# Patient Record
Sex: Female | Born: 1935 | Race: White | Hispanic: No | Marital: Single | Smoking: Never smoker
Health system: Southern US, Community
[De-identification: ages and names within clinical notes are randomized; demographics above are authoritative.]

## PROBLEM LIST (undated history)

## (undated) DIAGNOSIS — E785 Hyperlipidemia, unspecified: Secondary | ICD-10-CM

## (undated) DIAGNOSIS — R42 Dizziness and giddiness: Secondary | ICD-10-CM

## (undated) DIAGNOSIS — E559 Vitamin D deficiency, unspecified: Secondary | ICD-10-CM

## (undated) DIAGNOSIS — R55 Syncope and collapse: Secondary | ICD-10-CM

## (undated) DIAGNOSIS — R7303 Prediabetes: Secondary | ICD-10-CM

## (undated) DIAGNOSIS — E079 Disorder of thyroid, unspecified: Secondary | ICD-10-CM

## (undated) DIAGNOSIS — I10 Essential (primary) hypertension: Secondary | ICD-10-CM

## (undated) DIAGNOSIS — R011 Cardiac murmur, unspecified: Secondary | ICD-10-CM

## (undated) HISTORY — DX: Vitamin D deficiency, unspecified: E55.9

## (undated) HISTORY — DX: Cardiac murmur, unspecified: R01.1

## (undated) HISTORY — DX: Dizziness and giddiness: R42

## (undated) HISTORY — DX: Syncope and collapse: R55

## (undated) HISTORY — DX: Hyperlipidemia, unspecified: E78.5

## (undated) HISTORY — DX: Prediabetes: R73.03

## (undated) HISTORY — PX: TONSILLECTOMY: SUR1361

---

## 2008-05-12 ENCOUNTER — Encounter: Payer: Self-pay | Admitting: Pulmonary Disease

## 2008-05-20 DIAGNOSIS — J45909 Unspecified asthma, uncomplicated: Secondary | ICD-10-CM | POA: Insufficient documentation

## 2008-05-20 DIAGNOSIS — I10 Essential (primary) hypertension: Secondary | ICD-10-CM

## 2008-05-20 DIAGNOSIS — E039 Hypothyroidism, unspecified: Secondary | ICD-10-CM | POA: Insufficient documentation

## 2008-05-23 ENCOUNTER — Ambulatory Visit: Payer: Self-pay | Admitting: Pulmonary Disease

## 2008-05-23 DIAGNOSIS — R0602 Shortness of breath: Secondary | ICD-10-CM

## 2011-09-13 DIAGNOSIS — H6120 Impacted cerumen, unspecified ear: Secondary | ICD-10-CM | POA: Diagnosis not present

## 2011-09-13 DIAGNOSIS — M26609 Unspecified temporomandibular joint disorder, unspecified side: Secondary | ICD-10-CM | POA: Diagnosis not present

## 2012-06-11 DIAGNOSIS — Z0000 Encounter for general adult medical examination without abnormal findings: Secondary | ICD-10-CM | POA: Diagnosis not present

## 2012-06-11 DIAGNOSIS — E7800 Pure hypercholesterolemia, unspecified: Secondary | ICD-10-CM | POA: Diagnosis not present

## 2012-06-11 DIAGNOSIS — E119 Type 2 diabetes mellitus without complications: Secondary | ICD-10-CM | POA: Diagnosis not present

## 2012-06-11 DIAGNOSIS — I10 Essential (primary) hypertension: Secondary | ICD-10-CM | POA: Diagnosis not present

## 2012-06-11 DIAGNOSIS — E039 Hypothyroidism, unspecified: Secondary | ICD-10-CM | POA: Diagnosis not present

## 2012-06-11 DIAGNOSIS — Z79899 Other long term (current) drug therapy: Secondary | ICD-10-CM | POA: Diagnosis not present

## 2012-06-11 DIAGNOSIS — Z1331 Encounter for screening for depression: Secondary | ICD-10-CM | POA: Diagnosis not present

## 2013-01-22 DIAGNOSIS — E119 Type 2 diabetes mellitus without complications: Secondary | ICD-10-CM | POA: Diagnosis not present

## 2013-01-22 DIAGNOSIS — E039 Hypothyroidism, unspecified: Secondary | ICD-10-CM | POA: Diagnosis not present

## 2013-01-22 DIAGNOSIS — E7800 Pure hypercholesterolemia, unspecified: Secondary | ICD-10-CM | POA: Diagnosis not present

## 2013-01-22 DIAGNOSIS — K5900 Constipation, unspecified: Secondary | ICD-10-CM | POA: Diagnosis not present

## 2013-01-22 DIAGNOSIS — E559 Vitamin D deficiency, unspecified: Secondary | ICD-10-CM | POA: Diagnosis not present

## 2013-05-17 DIAGNOSIS — E039 Hypothyroidism, unspecified: Secondary | ICD-10-CM | POA: Diagnosis not present

## 2013-05-17 DIAGNOSIS — E7800 Pure hypercholesterolemia, unspecified: Secondary | ICD-10-CM | POA: Diagnosis not present

## 2013-05-17 DIAGNOSIS — E559 Vitamin D deficiency, unspecified: Secondary | ICD-10-CM | POA: Diagnosis not present

## 2013-06-15 DIAGNOSIS — Z23 Encounter for immunization: Secondary | ICD-10-CM | POA: Diagnosis not present

## 2013-06-22 DIAGNOSIS — I10 Essential (primary) hypertension: Secondary | ICD-10-CM | POA: Diagnosis not present

## 2013-06-22 DIAGNOSIS — M79609 Pain in unspecified limb: Secondary | ICD-10-CM | POA: Diagnosis not present

## 2013-07-29 DIAGNOSIS — I10 Essential (primary) hypertension: Secondary | ICD-10-CM | POA: Diagnosis not present

## 2013-07-29 DIAGNOSIS — E039 Hypothyroidism, unspecified: Secondary | ICD-10-CM | POA: Diagnosis not present

## 2013-07-31 ENCOUNTER — Emergency Department (HOSPITAL_COMMUNITY)
Admission: EM | Admit: 2013-07-31 | Discharge: 2013-07-31 | Disposition: A | Discharge status: Home / Self Care | Payer: Medicare Other | Attending: Emergency Medicine | Admitting: Emergency Medicine

## 2013-07-31 ENCOUNTER — Emergency Department (HOSPITAL_COMMUNITY): Payer: Medicare Other

## 2013-07-31 ENCOUNTER — Encounter (HOSPITAL_COMMUNITY): Payer: Self-pay | Admitting: Emergency Medicine

## 2013-07-31 DIAGNOSIS — Z79899 Other long term (current) drug therapy: Secondary | ICD-10-CM | POA: Insufficient documentation

## 2013-07-31 DIAGNOSIS — R42 Dizziness and giddiness: Secondary | ICD-10-CM | POA: Diagnosis not present

## 2013-07-31 DIAGNOSIS — E079 Disorder of thyroid, unspecified: Secondary | ICD-10-CM | POA: Diagnosis not present

## 2013-07-31 DIAGNOSIS — I10 Essential (primary) hypertension: Secondary | ICD-10-CM | POA: Diagnosis not present

## 2013-07-31 DIAGNOSIS — J45909 Unspecified asthma, uncomplicated: Secondary | ICD-10-CM | POA: Diagnosis not present

## 2013-07-31 HISTORY — DX: Disorder of thyroid, unspecified: E07.9

## 2013-07-31 HISTORY — DX: Essential (primary) hypertension: I10

## 2013-07-31 LAB — CBC
HCT: 42.1 % (ref 36.0–46.0)
Hemoglobin: 14.1 g/dL (ref 12.0–15.0)
MCH: 30.9 pg (ref 26.0–34.0)
MCHC: 33.5 g/dL (ref 30.0–36.0)
MCV: 92.1 fL (ref 78.0–100.0)
PLATELETS: 273 10*3/uL (ref 150–400)
RBC: 4.57 MIL/uL (ref 3.87–5.11)
RDW: 12.7 % (ref 11.5–15.5)
WBC: 7.7 10*3/uL (ref 4.0–10.5)

## 2013-07-31 LAB — BASIC METABOLIC PANEL
BUN: 15 mg/dL (ref 6–23)
CHLORIDE: 96 meq/L (ref 96–112)
CO2: 25 meq/L (ref 19–32)
CREATININE: 0.64 mg/dL (ref 0.50–1.10)
Calcium: 9.6 mg/dL (ref 8.4–10.5)
GFR calc non Af Amer: 82 mL/min — ABNORMAL LOW (ref >90)
Glucose, Bld: 129 mg/dL — ABNORMAL HIGH (ref 70–99)
POTASSIUM: 4.9 meq/L (ref 3.7–5.3)
SODIUM: 135 meq/L — AB (ref 137–147)

## 2013-07-31 LAB — POCT I-STAT TROPONIN I: Troponin i, poc: 0.01 ng/mL (ref 0.00–0.08)

## 2013-07-31 NOTE — ED Provider Notes (Signed)
CSN: [id number]     Arrival date & time 07/31/13  1504 History   First MD Initiated Contact with Patient 07/31/13 1818     Chief Complaint  Patient presents with  . Hypertension   (Consider location/radiation/quality/duration/timing/severity/associated sxs/prior Treatment) HPI Comments: Patient is an 80 year old female with past medical history of hypertension and thyroid disease who presents to the emergency department with hypertension x3 days. Patient states on Thursday when she checked her blood pressure at home, it was almost 200 systolic, went to her primary care physician for evaluation, he increased her losartan dose from 50 mg to 100 mg. Over the next few days her blood pressure remained with a systolic over 200, today she felt as if she was slightly dizzy, went to her primary care physician because her blood pressure was still 200 and she was advised to come to the emergency department. Patient states "if my blood pressure was not elevated, I probably would not have thought I was dizzy". States the symptom subsided as soon as she got to her physician's office. Denies chest pain, vision change, difficulty urinating, extremity edema. Patient admits to being under increased stress, she does not want to elaborate but states "it has nothing to do with medicine". On arrival to the emergency department, patient's blood pressure was 151/82.  Patient is a 80 y.o. female presenting with hypertension. The history is provided by the patient and medical records.  Hypertension    Past Medical History  Diagnosis Date  . Hypertension   . Thyroid disease    History reviewed. No pertinent past surgical history. History reviewed. No pertinent family history. History  Substance Use Topics  . Smoking status: Never Smoker   . Smokeless tobacco: Not on file  . Alcohol Use: No   OB History   Grav Para Term Preterm Abortions TAB SAB Ect Mult Living                 Review of Systems  Neurological:  Positive for dizziness.  All other systems reviewed and are negative.    Allergies  Review of patient's allergies indicates no known allergies.  Home Medications   Current Outpatient Rx  Name  Route  Sig  Dispense  Refill  . levothyroxine (SYNTHROID, LEVOTHROID) 50 MCG tablet   Oral   Take 50 mcg by mouth daily before breakfast.         . losartan (COZAAR) 100 MG tablet   Oral   Take 100 mg by mouth daily.          BP 180/70  Pulse 99  Temp(Src) 98.4 F (36.9 C) (Oral)  Resp 18  SpO2 99% Physical Exam  Nursing note and vitals reviewed. Constitutional: She is oriented to person, place, and time. She appears well-developed and well-nourished. No distress.  HENT:  Head: Normocephalic and atraumatic.  Mouth/Throat: Oropharynx is clear and moist.  Eyes: Conjunctivae and EOM are normal. Pupils are equal, round, and reactive to light.  Neck: Normal range of motion. Neck supple.  Cardiovascular: Normal rate, regular rhythm and normal heart sounds.   No extremity edema.  Pulmonary/Chest: Effort normal and breath sounds normal.  Abdominal: Soft. Bowel sounds are normal. There is no tenderness.  Musculoskeletal: Normal range of motion. She exhibits no edema.  Neurological: She is alert and oriented to person, place, and time. She has normal strength. No cranial nerve deficit or sensory deficit. Coordination normal.  Skin: Skin is warm and dry. She is not diaphoretic.  Psychiatric: She  has a normal mood and affect. Her behavior is normal.    ED Course  Procedures (including critical care time) Labs Review Labs Reviewed  BASIC METABOLIC PANEL - Abnormal; Notable for the following:    Sodium 135 (*)    Glucose, Bld 129 (*)    GFR calc non Af Amer 82 (*)    All other components within normal limits  CBC  POCT I-STAT TROPONIN I   Imaging Review Dg Chest 2 View  07/31/2013   CLINICAL DATA:  Hypertension.  Asthma.  Dyspnea.  EXAM: CHEST  2 VIEW  COMPARISON:  None.   FINDINGS: The heart size and mediastinal contours are within normal limits. Mild ectasia of thoracic aorta noted. No mass or lymphadenopathy identified. Both lungs are clear. The visualized skeletal structures are unremarkable.  IMPRESSION: No active cardiopulmonary disease.   Electronically Signed   By: Myles RosenthalJohn  Stahl M.D.   On: 07/31/2013 19:03    EKG Interpretation   None       MDM   1. Hypertension     Patient presenting with hypertension. She is well appearing and in no apparent distress. Normal vital signs. Blood pressure 151/52 in the emergency department. She is asymptomatic at this time. Labs, EKG pending. If normal, plan to discharge home. Case discussed with attending Dr. Karma GanjaLinker who agrees with plan of care. 8:39 PM Labs, CXR normal. Troponin negative. EKG showing PACs, otherwise no abnormalities. BP at one point during visit increased to 206/78, back down to 180/70. She is stable for discharge home. No signs of end-organ damage. Continue meds as prescribed. F/u with PCP. Pt also seen by Dr. Karma GanjaLinker, agrees with plan.    Trevor MaceRobyn M Albert, PA-C 07/31/13 2042

## 2013-07-31 NOTE — Discharge Instructions (Signed)
Continue taking your medications as prescribed.  Hypertension As your heart beats, it forces blood through your arteries. This force is your blood pressure. If the pressure is too high, it is called hypertension (HTN) or high blood pressure. HTN is dangerous because you may have it and not know it. High blood pressure may mean that your heart has to work harder to pump blood. Your arteries may be narrow or stiff. The extra work puts you at risk for heart disease, stroke, and other problems.  Blood pressure consists of two numbers, a higher number over a lower, 110/72, for example. It is stated as "110 over 72." The ideal is below 120 for the top number (systolic) and under 80 for the bottom (diastolic). Write down your blood pressure today. You should pay close attention to your blood pressure if you have certain conditions such as:  Heart failure.  Prior heart attack.  Diabetes  Chronic kidney disease.  Prior stroke.  Multiple risk factors for heart disease. To see if you have HTN, your blood pressure should be measured while you are seated with your arm held at the level of the heart. It should be measured at least twice. A one-time elevated blood pressure reading (especially in the Emergency Department) does not mean that you need treatment. There may be conditions in which the blood pressure is different between your right and left arms. It is important to see your caregiver soon for a recheck. Most people have essential hypertension which means that there is not a specific cause. This type of high blood pressure may be lowered by changing lifestyle factors such as:  Stress.  Smoking.  Lack of exercise.  Excessive weight.  Drug/tobacco/alcohol use.  Eating less salt. Most people do not have symptoms from high blood pressure until it has caused damage to the body. Effective treatment can often prevent, delay or reduce that damage. TREATMENT  When a cause has been identified,  treatment for high blood pressure is directed at the cause. There are a large number of medications to treat HTN. These fall into several categories, and your caregiver will help you select the medicines that are best for you. Medications may have side effects. You should review side effects with your caregiver. If your blood pressure stays high after you have made lifestyle changes or started on medicines,   Your medication(s) may need to be changed.  Other problems may need to be addressed.  Be certain you understand your prescriptions, and know how and when to take your medicine.  Be sure to follow up with your caregiver within the time frame advised (usually within two weeks) to have your blood pressure rechecked and to review your medications.  If you are taking more than one medicine to lower your blood pressure, make sure you know how and at what times they should be taken. Taking two medicines at the same time can result in blood pressure that is too low. SEEK IMMEDIATE MEDICAL CARE IF:  You develop a severe headache, blurred or changing vision, or confusion.  You have unusual weakness or numbness, or a faint feeling.  You have severe chest or abdominal pain, vomiting, or breathing problems. MAKE SURE YOU:   Understand these instructions.  Will watch your condition.  Will get help right away if you are not doing well or get worse. Document Released: 06/24/2005 Document Revised: 09/16/2011 Document Reviewed: 02/12/2008 [facility] Patient Information 2014 [employer] [place].

## 2013-07-31 NOTE — ED Notes (Signed)
[facility] 200 x 3 days.  Denies complaint.

## 2013-07-31 NOTE — ED Provider Notes (Addendum)
Medical screening examination/treatment/procedure(s) were conducted as a shared visit with non-physician practitioner(s) and myself.  I personally evaluated the patient during the encounter.  EKG Interpretation   None       Date: 07/31/2013  Rate: 73  Rhythm: sinus rhythm with PACs  QRS Axis: normal  Intervals: normal  ST/T Wave abnormalities: normal  Conduction Disutrbances:none  Narrative Interpretation:   Old EKG Reviewed: none available  EKG not available in epic to be interpreted in MUSE  Pt seen and examined.  Awake, alert and oriented x 3, normal speech. Pt here with hypertension, no  asymptomac, normal exam.  No signs of end organ damage. Pt has had BP meds increased by her primary doctor, advised to have BP rechecked with PMD.  Discussed strict return precautions.    Ethelda ChickMartha K Linker, MD 07/31/13 2046  Ethelda ChickMartha K Linker, MD 07/31/13 [phone]

## 2013-08-10 DIAGNOSIS — F411 Generalized anxiety disorder: Secondary | ICD-10-CM | POA: Diagnosis not present

## 2013-08-10 DIAGNOSIS — H6120 Impacted cerumen, unspecified ear: Secondary | ICD-10-CM | POA: Diagnosis not present

## 2013-08-10 DIAGNOSIS — R42 Dizziness and giddiness: Secondary | ICD-10-CM | POA: Diagnosis not present

## 2013-08-10 DIAGNOSIS — I10 Essential (primary) hypertension: Secondary | ICD-10-CM | POA: Diagnosis not present

## 2013-08-17 ENCOUNTER — Encounter (HOSPITAL_COMMUNITY): Payer: Self-pay | Admitting: Emergency Medicine

## 2013-08-17 ENCOUNTER — Emergency Department (HOSPITAL_COMMUNITY)
Admission: EM | Admit: 2013-08-17 | Discharge: 2013-08-17 | Disposition: A | Discharge status: Home / Self Care | Payer: Medicare Other | Attending: Emergency Medicine | Admitting: Emergency Medicine

## 2013-08-17 DIAGNOSIS — Z7982 Long term (current) use of aspirin: Secondary | ICD-10-CM | POA: Diagnosis not present

## 2013-08-17 DIAGNOSIS — M2550 Pain in unspecified joint: Secondary | ICD-10-CM | POA: Insufficient documentation

## 2013-08-17 DIAGNOSIS — Z79899 Other long term (current) drug therapy: Secondary | ICD-10-CM | POA: Diagnosis not present

## 2013-08-17 DIAGNOSIS — I10 Essential (primary) hypertension: Secondary | ICD-10-CM | POA: Diagnosis not present

## 2013-08-17 DIAGNOSIS — E079 Disorder of thyroid, unspecified: Secondary | ICD-10-CM | POA: Diagnosis not present

## 2013-08-17 DIAGNOSIS — R52 Pain, unspecified: Secondary | ICD-10-CM | POA: Diagnosis not present

## 2013-08-17 DIAGNOSIS — R5381 Other malaise: Secondary | ICD-10-CM | POA: Diagnosis not present

## 2013-08-17 DIAGNOSIS — R5383 Other fatigue: Secondary | ICD-10-CM | POA: Diagnosis not present

## 2013-08-17 NOTE — ED Provider Notes (Signed)
CSN: [id number]     Arrival date & time 08/17/13  1205 History   First MD Initiated Contact with Patient 08/17/13 1311     Chief Complaint  Patient presents with  . Hypertension     (Consider location/radiation/quality/duration/timing/severity/associated sxs/prior Treatment) HPI Comments: 80 yo female with htn, hypothyroid presents for htn.  Pt has had bp elevated 140s to 200 the past few weeks.  She had flu like sxs the past few days without fever, mild body aches and fatigue. Sxs resolved.  Currently no sxs.  No cp or sob.  No cardiac hx.  No abdo pain.  Intermittent bp changes.  Pt taking losartan 100mg  and hctz, recent increase in losartan.   Patient is a 80 y.o. female presenting with hypertension. The history is provided by the patient.  Hypertension This is a recurrent problem. Pertinent negatives include no chest pain, no abdominal pain, no headaches and no shortness of breath.    Past Medical History  Diagnosis Date  . Hypertension   . Thyroid disease    History reviewed. No pertinent past surgical history. History reviewed. No pertinent family history. History  Substance Use Topics  . Smoking status: Never Smoker   . Smokeless tobacco: Not on file  . Alcohol Use: No   OB History   Grav Para Term Preterm Abortions TAB SAB Ect Mult Living                 Review of Systems  Constitutional: Positive for fatigue. Negative for fever and chills.  HENT: Negative for congestion.   Eyes: Negative for visual disturbance.  Respiratory: Negative for shortness of breath.   Cardiovascular: Negative for chest pain.  Gastrointestinal: Negative for vomiting and abdominal pain.  Genitourinary: Negative for dysuria and flank pain.  Musculoskeletal: Positive for arthralgias. Negative for back pain, neck pain and neck stiffness.  Skin: Negative for rash.  Neurological: Negative for light-headedness and headaches.      Allergies  Review of patient's allergies indicates no known  allergies.  Home Medications   Current Outpatient Rx  Name  Route  Sig  Dispense  Refill  . aspirin EC 325 MG tablet   Oral   Take 325 mg by mouth every 6 (six) hours as needed (pain).         . hydrochlorothiazide (HYDRODIURIL) 25 MG tablet   Oral   Take 12.5 mg by mouth daily. Takes half tab of 25mg          . levothyroxine (SYNTHROID, LEVOTHROID) 50 MCG tablet   Oral   Take 50 mcg by mouth daily before breakfast.         . losartan (COZAAR) 100 MG tablet   Oral   Take 100 mg by mouth daily.          BP 164/87  Pulse 95  Temp(Src) 97.9 F (36.6 C)  Resp 16  SpO2 97% Physical Exam  Nursing note and vitals reviewed. Constitutional: She is oriented to person, place, and time. She appears well-developed and well-nourished.  HENT:  Head: Normocephalic and atraumatic.  Eyes: Conjunctivae are normal. Right eye exhibits no discharge. Left eye exhibits no discharge.  Neck: Normal range of motion. Neck supple. No tracheal deviation present.  Cardiovascular: Normal rate and regular rhythm.   Pulmonary/Chest: Effort normal and breath sounds normal.  Abdominal: Soft. She exhibits no distension. There is no tenderness. There is no guarding.  Musculoskeletal: She exhibits no edema.  Neurological: She is alert and oriented to person,  place, and time. No cranial nerve deficit.  Skin: Skin is warm. No rash noted.  Psychiatric: She has a normal mood and affect.    ED Course  Procedures (including critical care time) Labs Review Labs Reviewed - No data to display Imaging Review No results found.  EKG Interpretation   None       MDM   Final diagnoses:  HTN (hypertension)    Asymptomatic HTN.  Long discussion regarding reasons to return, Na intake, possible change in medicines with pcp. Pt had recent CXR/ blood work.  No signs of end organ damage clinically. Results and differential diagnosis were discussed with the patient. Close follow up outpatient was  discussed, patient comfortable with the plan.  Filed Vitals:   08/17/13 1240  BP: 164/87  Pulse: 95  Temp: 97.9 F (36.6 C)  Resp: 16  SpO2: 97%         Enid SkeensJoshua M Carline Dura, MD 08/17/13 1430

## 2013-08-17 NOTE — Discharge Instructions (Signed)
Limit salt intake. Continue normal exercise. Take blood pressure daily when relaxed.  If you were given medicines take as directed.  If you are on coumadin or contraceptives realize their levels and effectiveness is altered by many different medicines.  If you have any reaction (rash, tongues swelling, other) to the medicines stop taking and see a physician.   Please follow up as directed and return to the ER or see a physician for new or worsening symptoms (chest pain, headache, shortness of breath).  Thank you.

## 2013-08-17 NOTE — ED Notes (Signed)
Pt c/o hypertension, fatigue, and body aches x 5 days.  Pain score 5/10.  Pt sts she and her PCP have been having problems managing her blood pressure since January.  [facility].  Pt sts "it's like my muscles hurt."

## 2013-08-19 DIAGNOSIS — I10 Essential (primary) hypertension: Secondary | ICD-10-CM | POA: Diagnosis not present

## 2013-08-19 DIAGNOSIS — H6120 Impacted cerumen, unspecified ear: Secondary | ICD-10-CM | POA: Diagnosis not present

## 2013-12-29 DIAGNOSIS — H2510 Age-related nuclear cataract, unspecified eye: Secondary | ICD-10-CM | POA: Diagnosis not present

## 2013-12-29 DIAGNOSIS — H20019 Primary iridocyclitis, unspecified eye: Secondary | ICD-10-CM | POA: Diagnosis not present

## 2014-01-06 DIAGNOSIS — H20019 Primary iridocyclitis, unspecified eye: Secondary | ICD-10-CM | POA: Diagnosis not present

## 2014-05-17 DIAGNOSIS — H16223 Keratoconjunctivitis sicca, not specified as Sjogren's, bilateral: Secondary | ICD-10-CM | POA: Diagnosis not present

## 2014-05-23 DIAGNOSIS — E119 Type 2 diabetes mellitus without complications: Secondary | ICD-10-CM | POA: Diagnosis not present

## 2014-05-23 DIAGNOSIS — I10 Essential (primary) hypertension: Secondary | ICD-10-CM | POA: Diagnosis not present

## 2014-05-23 DIAGNOSIS — E780 Pure hypercholesterolemia: Secondary | ICD-10-CM | POA: Diagnosis not present

## 2014-05-23 DIAGNOSIS — E039 Hypothyroidism, unspecified: Secondary | ICD-10-CM | POA: Diagnosis not present

## 2014-05-23 DIAGNOSIS — E559 Vitamin D deficiency, unspecified: Secondary | ICD-10-CM | POA: Diagnosis not present

## 2014-05-23 DIAGNOSIS — Z23 Encounter for immunization: Secondary | ICD-10-CM | POA: Diagnosis not present

## 2014-08-09 DIAGNOSIS — H578 Other specified disorders of eye and adnexa: Secondary | ICD-10-CM | POA: Diagnosis not present

## 2014-08-09 DIAGNOSIS — K5900 Constipation, unspecified: Secondary | ICD-10-CM | POA: Diagnosis not present

## 2014-08-09 DIAGNOSIS — R109 Unspecified abdominal pain: Secondary | ICD-10-CM | POA: Diagnosis not present

## 2014-08-15 DIAGNOSIS — R109 Unspecified abdominal pain: Secondary | ICD-10-CM | POA: Diagnosis not present

## 2014-08-15 DIAGNOSIS — K219 Gastro-esophageal reflux disease without esophagitis: Secondary | ICD-10-CM | POA: Diagnosis not present

## 2014-09-08 ENCOUNTER — Other Ambulatory Visit: Payer: Self-pay | Admitting: Family Medicine

## 2014-09-08 ENCOUNTER — Ambulatory Visit
Admission: RE | Admit: 2014-09-08 | Discharge: 2014-09-08 | Disposition: A | Discharge status: Home / Self Care | Payer: Medicare Other | Source: Ambulatory Visit | Attending: Family Medicine | Admitting: Family Medicine

## 2014-09-08 DIAGNOSIS — R1032 Left lower quadrant pain: Secondary | ICD-10-CM

## 2014-09-08 DIAGNOSIS — R109 Unspecified abdominal pain: Secondary | ICD-10-CM | POA: Diagnosis not present

## 2014-12-09 IMAGING — CR DG CHEST 2V
2 series · 2 of 2 positions shown · non-contrast
Comparison: None.

CLINICAL DATA: Hypertension.  Asthma.  Dyspnea.

EXAM:
CHEST  2 VIEW

[w chest pa]
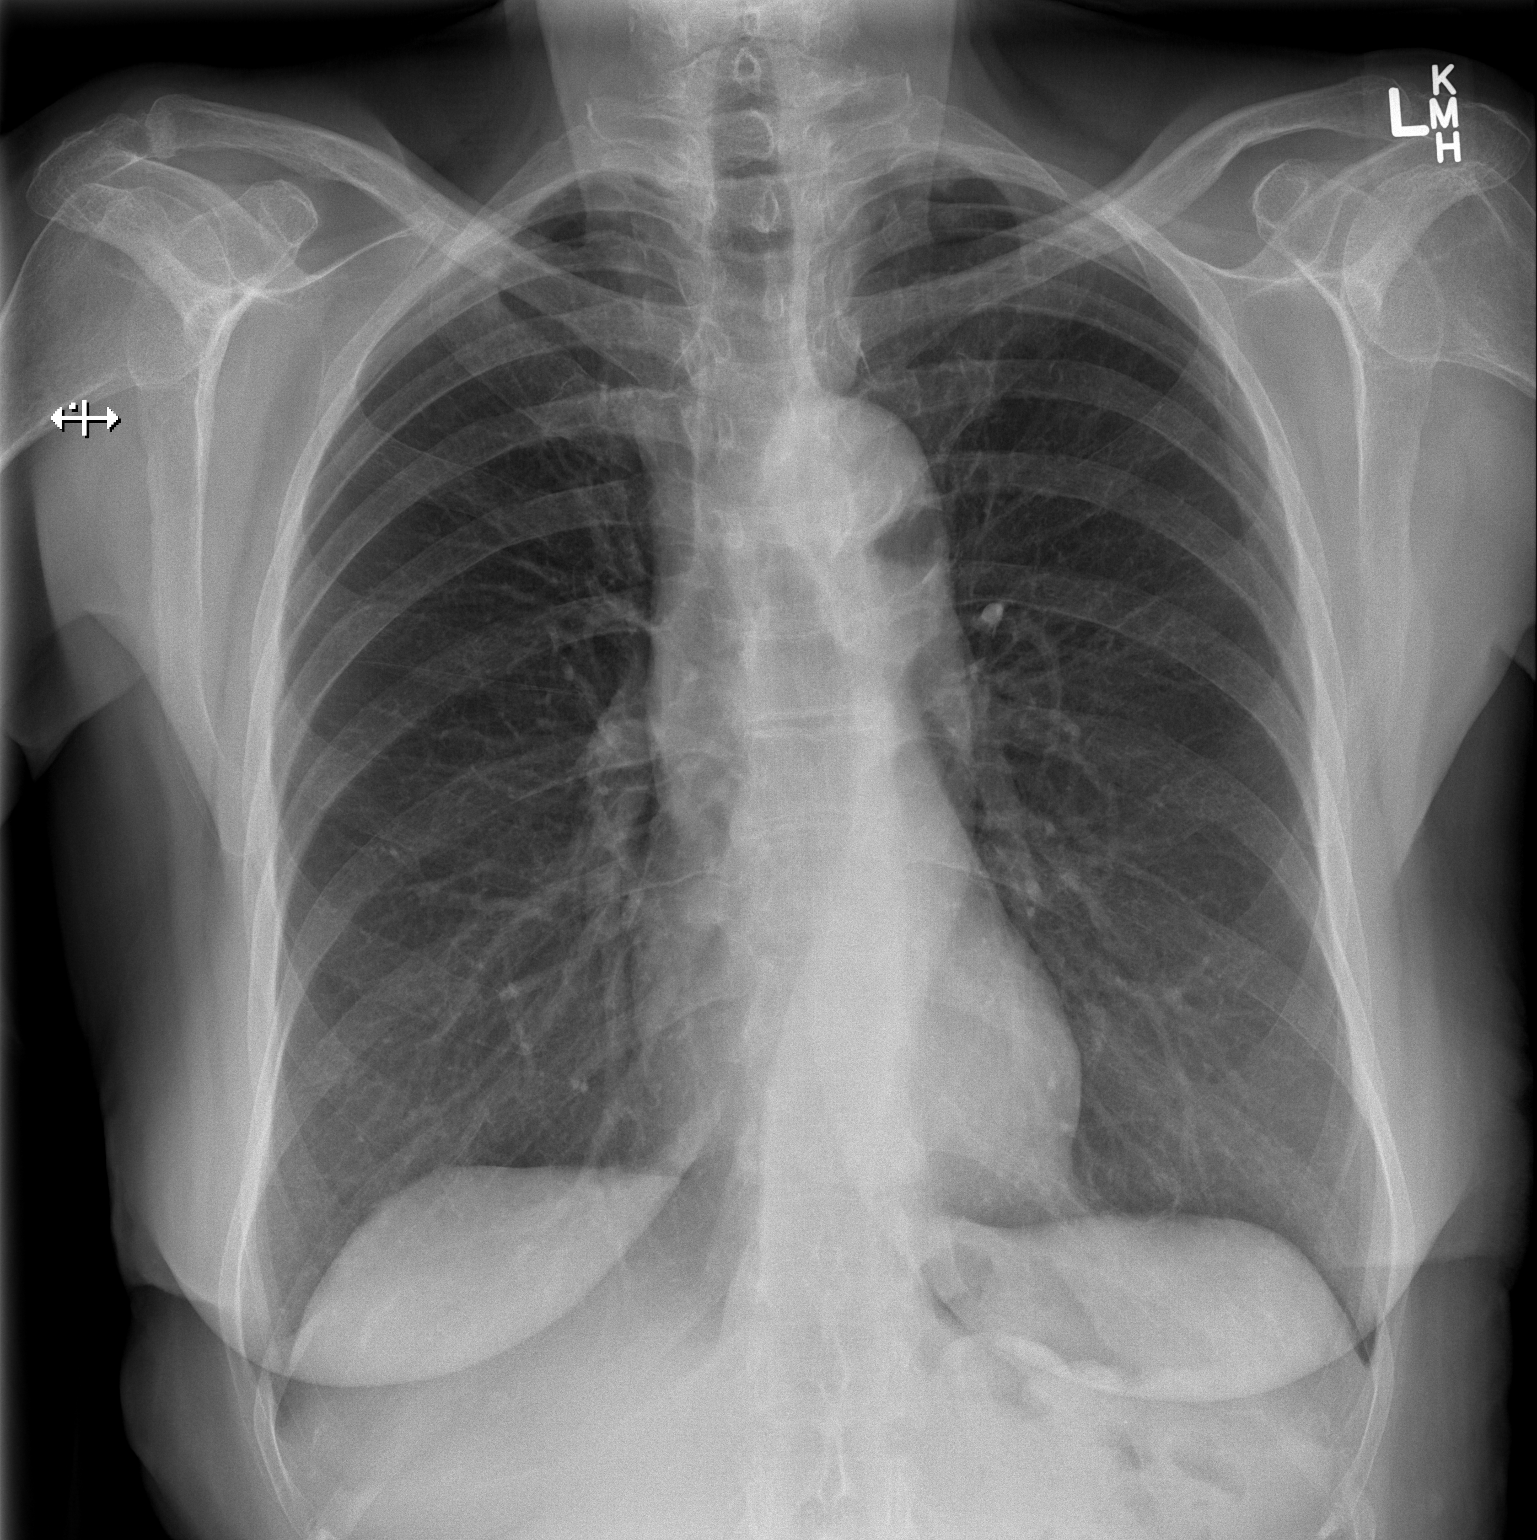

[w chest lat]
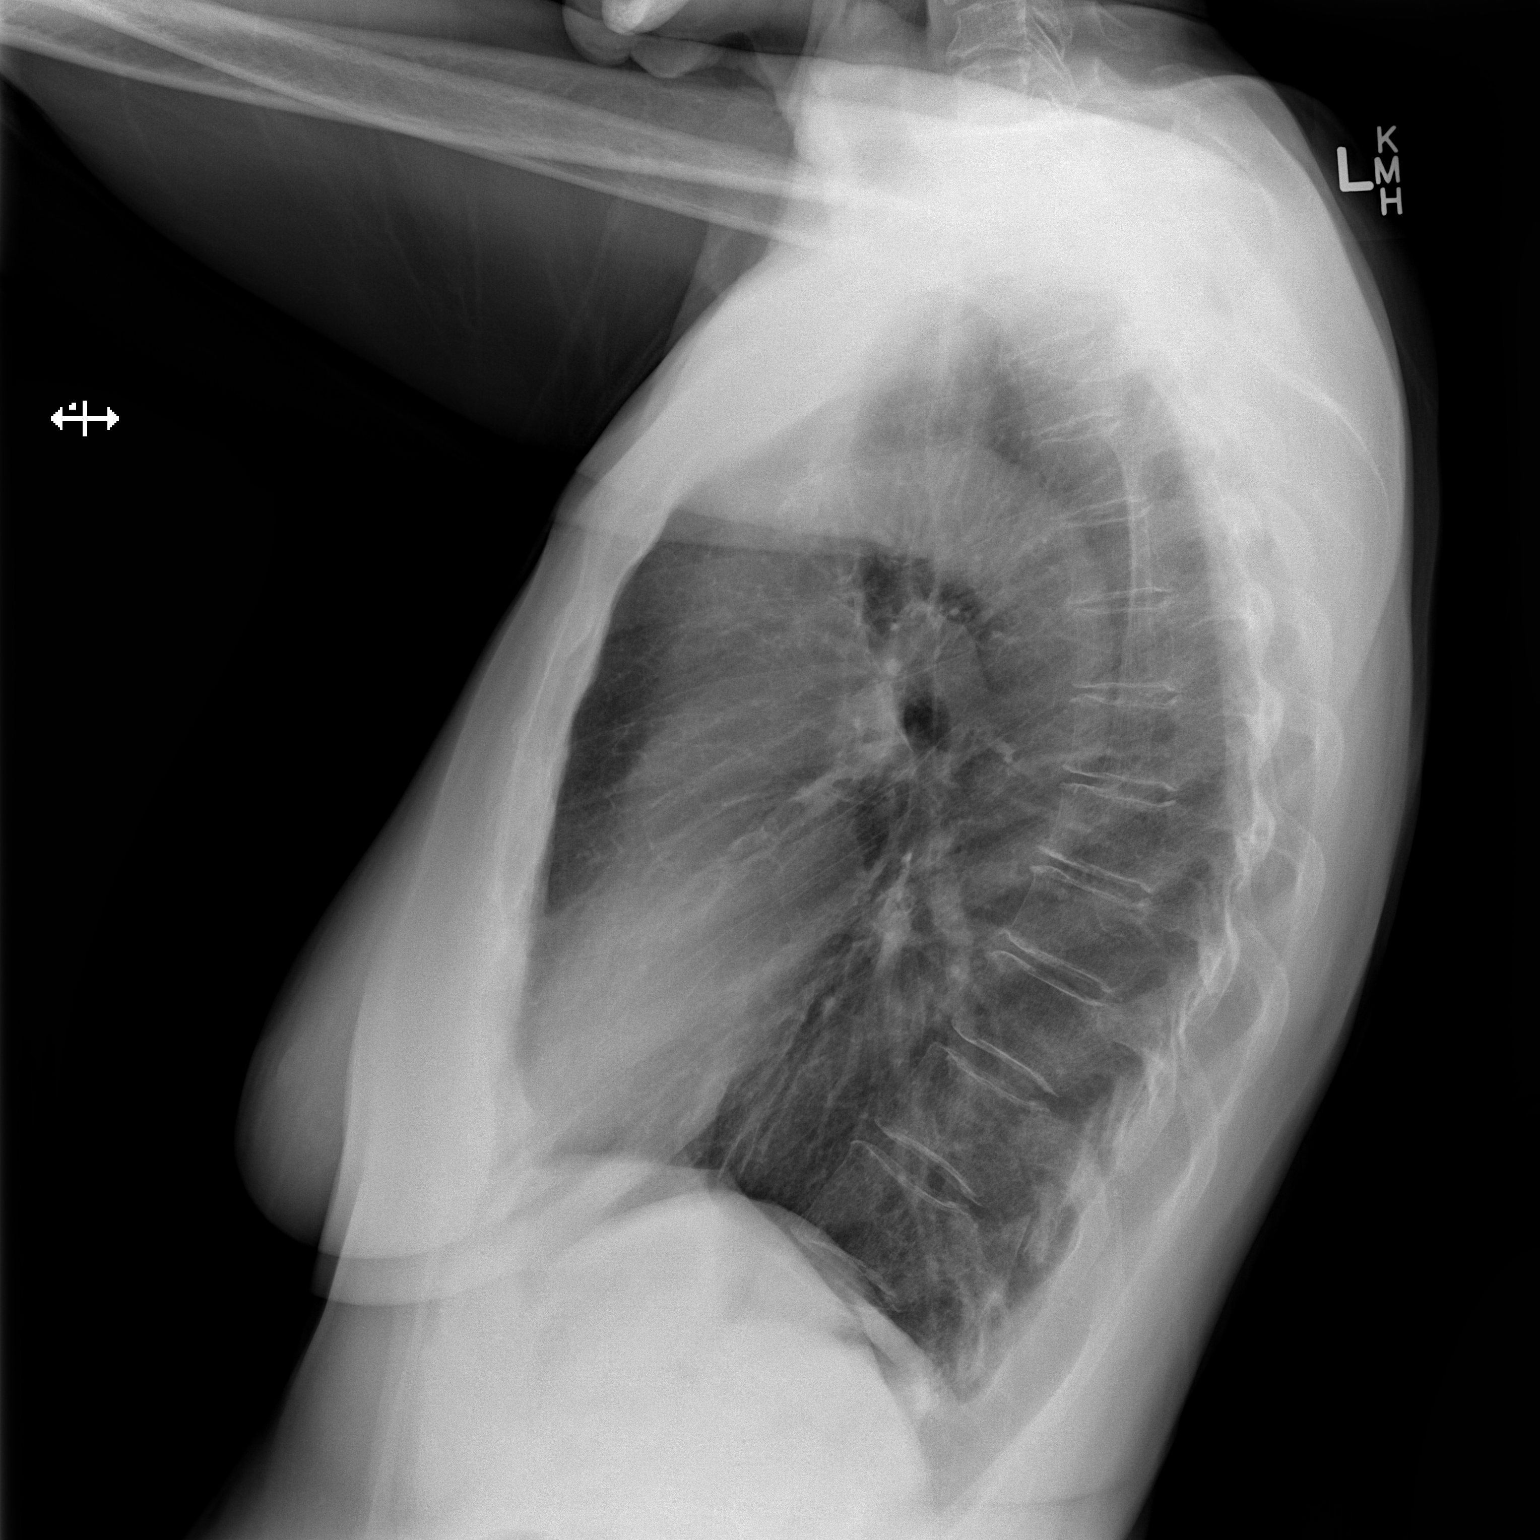

[2 of 2 positions shown; findings below may reference images not displayed]

FINDINGS: The heart size and mediastinal contours are within normal limits.
Mild ectasia of thoracic aorta noted. No mass or lymphadenopathy
identified. Both lungs are clear. The visualized skeletal structures
are unremarkable.
IMPRESSION: No active cardiopulmonary disease.

## 2015-01-23 DIAGNOSIS — M5430 Sciatica, unspecified side: Secondary | ICD-10-CM | POA: Diagnosis not present

## 2015-04-21 DIAGNOSIS — H2513 Age-related nuclear cataract, bilateral: Secondary | ICD-10-CM | POA: Diagnosis not present

## 2015-05-26 DIAGNOSIS — Z136 Encounter for screening for cardiovascular disorders: Secondary | ICD-10-CM | POA: Diagnosis not present

## 2015-05-26 DIAGNOSIS — Z23 Encounter for immunization: Secondary | ICD-10-CM | POA: Diagnosis not present

## 2015-05-26 DIAGNOSIS — I10 Essential (primary) hypertension: Secondary | ICD-10-CM | POA: Diagnosis not present

## 2015-05-26 DIAGNOSIS — E119 Type 2 diabetes mellitus without complications: Secondary | ICD-10-CM | POA: Diagnosis not present

## 2015-05-26 DIAGNOSIS — M1990 Unspecified osteoarthritis, unspecified site: Secondary | ICD-10-CM | POA: Diagnosis not present

## 2015-05-26 DIAGNOSIS — E039 Hypothyroidism, unspecified: Secondary | ICD-10-CM | POA: Diagnosis not present

## 2015-05-26 DIAGNOSIS — E559 Vitamin D deficiency, unspecified: Secondary | ICD-10-CM | POA: Diagnosis not present

## 2015-07-04 DIAGNOSIS — H6123 Impacted cerumen, bilateral: Secondary | ICD-10-CM | POA: Diagnosis not present

## 2015-07-04 DIAGNOSIS — I10 Essential (primary) hypertension: Secondary | ICD-10-CM | POA: Diagnosis not present

## 2015-10-24 DIAGNOSIS — R634 Abnormal weight loss: Secondary | ICD-10-CM | POA: Diagnosis not present

## 2015-10-24 DIAGNOSIS — R5381 Other malaise: Secondary | ICD-10-CM | POA: Diagnosis not present

## 2015-10-24 DIAGNOSIS — M791 Myalgia: Secondary | ICD-10-CM | POA: Diagnosis not present

## 2015-11-15 DIAGNOSIS — R5381 Other malaise: Secondary | ICD-10-CM | POA: Diagnosis not present

## 2015-11-15 DIAGNOSIS — M353 Polymyalgia rheumatica: Secondary | ICD-10-CM | POA: Diagnosis not present

## 2015-11-15 DIAGNOSIS — R634 Abnormal weight loss: Secondary | ICD-10-CM | POA: Diagnosis not present

## 2015-11-15 DIAGNOSIS — I10 Essential (primary) hypertension: Secondary | ICD-10-CM | POA: Diagnosis not present

## 2015-11-22 DIAGNOSIS — R634 Abnormal weight loss: Secondary | ICD-10-CM | POA: Diagnosis not present

## 2015-11-22 DIAGNOSIS — M25569 Pain in unspecified knee: Secondary | ICD-10-CM | POA: Diagnosis not present

## 2015-11-22 DIAGNOSIS — M2550 Pain in unspecified joint: Secondary | ICD-10-CM | POA: Diagnosis not present

## 2015-11-22 DIAGNOSIS — R5383 Other fatigue: Secondary | ICD-10-CM | POA: Diagnosis not present

## 2015-11-22 DIAGNOSIS — R700 Elevated erythrocyte sedimentation rate: Secondary | ICD-10-CM | POA: Diagnosis not present

## 2015-11-22 DIAGNOSIS — R531 Weakness: Secondary | ICD-10-CM | POA: Diagnosis not present

## 2015-11-23 ENCOUNTER — Other Ambulatory Visit: Payer: Self-pay | Admitting: Rheumatology

## 2015-11-23 DIAGNOSIS — R911 Solitary pulmonary nodule: Secondary | ICD-10-CM

## 2015-12-01 ENCOUNTER — Ambulatory Visit
Admission: RE | Admit: 2015-12-01 | Discharge: 2015-12-01 | Disposition: A | Discharge status: Home / Self Care | Payer: Medicare Other | Source: Ambulatory Visit | Attending: Rheumatology | Admitting: Rheumatology

## 2015-12-01 DIAGNOSIS — R911 Solitary pulmonary nodule: Secondary | ICD-10-CM | POA: Diagnosis not present

## 2015-12-21 DIAGNOSIS — M170 Bilateral primary osteoarthritis of knee: Secondary | ICD-10-CM | POA: Diagnosis not present

## 2015-12-21 DIAGNOSIS — R768 Other specified abnormal immunological findings in serum: Secondary | ICD-10-CM | POA: Diagnosis not present

## 2015-12-21 DIAGNOSIS — R634 Abnormal weight loss: Secondary | ICD-10-CM | POA: Diagnosis not present

## 2015-12-21 DIAGNOSIS — R5383 Other fatigue: Secondary | ICD-10-CM | POA: Diagnosis not present

## 2015-12-21 DIAGNOSIS — R531 Weakness: Secondary | ICD-10-CM | POA: Diagnosis not present

## 2016-02-20 DIAGNOSIS — I10 Essential (primary) hypertension: Secondary | ICD-10-CM | POA: Diagnosis not present

## 2016-02-20 DIAGNOSIS — R799 Abnormal finding of blood chemistry, unspecified: Secondary | ICD-10-CM | POA: Diagnosis not present

## 2016-02-20 DIAGNOSIS — E039 Hypothyroidism, unspecified: Secondary | ICD-10-CM | POA: Diagnosis not present

## 2016-02-20 DIAGNOSIS — R739 Hyperglycemia, unspecified: Secondary | ICD-10-CM | POA: Diagnosis not present

## 2016-02-20 DIAGNOSIS — R42 Dizziness and giddiness: Secondary | ICD-10-CM | POA: Diagnosis not present

## 2016-02-29 ENCOUNTER — Emergency Department (HOSPITAL_COMMUNITY)
Admission: EM | Admit: 2016-02-29 | Discharge: 2016-02-29 | Disposition: A | Discharge status: Home / Self Care | Payer: Medicare Other | Attending: Emergency Medicine | Admitting: Emergency Medicine

## 2016-02-29 ENCOUNTER — Emergency Department (HOSPITAL_COMMUNITY): Payer: Medicare Other

## 2016-02-29 ENCOUNTER — Encounter (HOSPITAL_COMMUNITY): Payer: Self-pay | Admitting: Emergency Medicine

## 2016-02-29 ENCOUNTER — Emergency Department (HOSPITAL_BASED_OUTPATIENT_CLINIC_OR_DEPARTMENT_OTHER): Payer: Medicare Other

## 2016-02-29 DIAGNOSIS — J45909 Unspecified asthma, uncomplicated: Secondary | ICD-10-CM | POA: Insufficient documentation

## 2016-02-29 DIAGNOSIS — E039 Hypothyroidism, unspecified: Secondary | ICD-10-CM | POA: Diagnosis not present

## 2016-02-29 DIAGNOSIS — R0602 Shortness of breath: Secondary | ICD-10-CM

## 2016-02-29 DIAGNOSIS — Z7984 Long term (current) use of oral hypoglycemic drugs: Secondary | ICD-10-CM | POA: Diagnosis not present

## 2016-02-29 DIAGNOSIS — Z79899 Other long term (current) drug therapy: Secondary | ICD-10-CM | POA: Diagnosis not present

## 2016-02-29 DIAGNOSIS — I2699 Other pulmonary embolism without acute cor pulmonale: Secondary | ICD-10-CM

## 2016-02-29 DIAGNOSIS — R05 Cough: Secondary | ICD-10-CM | POA: Diagnosis not present

## 2016-02-29 DIAGNOSIS — I10 Essential (primary) hypertension: Secondary | ICD-10-CM | POA: Diagnosis not present

## 2016-02-29 LAB — DIFFERENTIAL
Basophils Absolute: 0 K/uL (ref 0.0–0.1)
Basophils Relative: 0 %
Eosinophils Absolute: 0 K/uL (ref 0.0–0.7)
Eosinophils Relative: 0 %
Lymphocytes Relative: 21 %
Lymphs Abs: 1.5 K/uL (ref 0.7–4.0)
Monocytes Absolute: 0.4 K/uL (ref 0.1–1.0)
Monocytes Relative: 5 %
Neutro Abs: 5.4 K/uL (ref 1.7–7.7)
Neutrophils Relative %: 74 %

## 2016-02-29 LAB — CBC
HCT: 37.7 % (ref 36.0–46.0)
Hemoglobin: 12.3 g/dL (ref 12.0–15.0)
MCH: 28.6 pg (ref 26.0–34.0)
MCHC: 32.6 g/dL (ref 30.0–36.0)
MCV: 87.7 fL (ref 78.0–100.0)
Platelets: 336 10*3/uL (ref 150–400)
RBC: 4.3 MIL/uL (ref 3.87–5.11)
RDW: 14.3 % (ref 11.5–15.5)
WBC: 7.3 10*3/uL (ref 4.0–10.5)

## 2016-02-29 LAB — I-STAT TROPONIN, ED
TROPONIN I, POC: 0 ng/mL (ref 0.00–0.08)
Troponin i, poc: 0 ng/mL (ref 0.00–0.08)

## 2016-02-29 LAB — BASIC METABOLIC PANEL
ANION GAP: 9 (ref 5–15)
BUN: 17 mg/dL (ref 6–20)
CHLORIDE: 100 mmol/L — AB (ref 101–111)
CO2: 25 mmol/L (ref 22–32)
Calcium: 9.2 mg/dL (ref 8.9–10.3)
Creatinine, Ser: 0.52 mg/dL (ref 0.44–1.00)
GFR calc non Af Amer: >60 mL/min (ref >60)
Glucose, Bld: 157 mg/dL — ABNORMAL HIGH (ref 65–99)
Potassium: 3.6 mmol/L (ref 3.5–5.1)
Sodium: 134 mmol/L — ABNORMAL LOW (ref 135–145)

## 2016-02-29 LAB — BRAIN NATRIURETIC PEPTIDE: B Natriuretic Peptide: 38 pg/mL (ref 0.0–100.0)

## 2016-02-29 LAB — D-DIMER, QUANTITATIVE: D-Dimer, Quant: 1.97 ug{FEU}/mL — ABNORMAL HIGH (ref 0.00–0.50)

## 2016-02-29 MED ORDER — ALBUTEROL SULFATE (2.5 MG/3ML) 0.083% IN NEBU: 5.0000 mg | INHALATION_SOLUTION | Freq: Once | RESPIRATORY_TRACT | Status: DC

## 2016-02-29 MED ORDER — RIVAROXABAN (XARELTO) EDUCATION KIT FOR DVT/PE PATIENTS
PACK | Freq: Once | Status: DC
  Filled 2016-02-29: qty 1

## 2016-02-29 MED ORDER — IOPAMIDOL (ISOVUE-370) INJECTION 76%
100.0000 mL | Freq: Once | INTRAVENOUS | Status: AC | PRN
  Administered 2016-02-29: 56 mL via INTRAVENOUS

## 2016-02-29 MED ORDER — RIVAROXABAN (XARELTO) VTE STARTER PACK (15 & 20 MG): ORAL_TABLET | ORAL | 0 refills | Status: DC

## 2016-02-29 NOTE — ED Provider Notes (Signed)
Care assumed from previous provider PA Dowless. Please see note for further details. Briefly, patient is a very healthy 82 yo female with PMH of HTN who presents for 5-6 days of nonspecific shortness of breath. D-dimer was elevated. CT angio pending at shift change. Case discussed, plan agreed upon. Will follow up on CT angio results. If negative, discharge to home.   CT angio: IMPRESSION:  Tiny nonocclusive embolus in a lingular pulmonary arterial segmental  branch, possibly chronic.    No central pulmonary embolus. No evidence of right heart strain.    Calcific atherosclerotic disease of the coronary arteries and aorta.    Case discussed with attending, Dr. Madilyn Hookees. Bilateral DVT study ordered which was negative.   Plan: Xarelto - discussed bleeding risks. PCP follow up early next week - spoke at length of the importance of PCP follow up. A significant amount of time was taken to discuss reasons to return to ED. All questions answered.  Patient seen by and discussed with Dr. Madilyn Hookees who agrees with treatment plan.        [facility] Center[facility] Hobbs Christina Vanoverbeke, PA-C 03/01/16 0040    Tilden FossaElizabeth Rees, MD 03/01/16 [phone]

## 2016-02-29 NOTE — ED Provider Notes (Signed)
[facility]-EMERGENCY DEPT Provider Note   CSN: [id number] Arrival date & time: 02/29/16  1244     History   Chief Complaint Chief Complaint  Patient presents with  . Shortness of Breath    HPI Christina Hobbs is a 82 y.o. female with a past medical history of HTN who presents to the ED today complaining of shortness of breath. Patient states that one week ago and PCP for regular follow-up and had an EKG performed. She was told that time that it appeared abnormal and she was scheduled to have a follow-up with a cardiologist next month. Over the last 5 days patient states that while she is at rest she occasionally feels like she cannot catch her breath. Pt states that her SOB actually improves when she is walking because it "distracts her".   HPI  Past Medical History:  Diagnosis Date  . Hypertension   . Thyroid disease     Patient Active Problem List   Diagnosis Date Noted  . DYSPNEA 05/23/2008  . HYPOTHYROIDISM 05/20/2008  . HYPERTENSION 05/20/2008  . ASTHMA 05/20/2008    Past Surgical History:  Procedure Laterality Date  . TONSILLECTOMY      OB History    No data available       Home Medications    Prior to Admission medications   Medication Sig Start Date End Date Taking? Authorizing Provider  amLODipine (NORVASC) 10 MG tablet Take 10 mg by mouth daily with breakfast.   Yes Historical Provider, MD  Aromatic Inhalants (VICKS VAPOINHALER) INHA Inhale 2-3 puffs into the lungs daily as needed (for shortness of breath).   Yes Historical Provider, MD  Cholecalciferol (VITAMIN D) 2000 units tablet Take 2,000 Units by mouth every evening.   Yes Historical Provider, MD  Dextromethorphan-Guaifenesin (CORICIDIN HBP CONGESTION/COUGH) 10-200 MG CAPS Take 2 capsules by mouth once.   Yes Historical Provider, MD  Glycerin-Hypromellose-PEG 400 ([employer] DRY EYE RELIEF) 0.2-0.2-1 % SOLN Place 2 drops into both eyes daily as needed (for dry eyes).   Yes Historical Provider, MD  levothyroxine  (SYNTHROID, LEVOTHROID) 50 MCG tablet Take 50 mcg by mouth daily before breakfast.   Yes Historical Provider, MD  metFORMIN (GLUCOPHAGE-XR) 500 MG 24 hr tablet Take 500 mg by mouth every evening.   Yes Historical Provider, MD    Family History No family history on file.  Social History Social History  Substance Use Topics  . Smoking status: Never Smoker  . Smokeless tobacco: Never Used  . Alcohol use No     Allergies   Review of patient's allergies indicates no known allergies.   Review of Systems Review of Systems  All other systems reviewed and are negative.    Physical Exam Updated Vital Signs BP 171/76   Pulse 105   Temp 99.2 F (37.3 C)   Resp 16   SpO2 100%   Physical Exam  Constitutional: She is oriented to person, place, and time. She appears well-developed and well-nourished. No distress.  HENT:  Head: Normocephalic and atraumatic.  Mouth/Throat: No oropharyngeal exudate.  Eyes: Conjunctivae and EOM are normal. Pupils are equal, round, and reactive to light. Right eye exhibits no discharge. Left eye exhibits no discharge. No scleral icterus.  Cardiovascular: Normal rate, regular rhythm, normal heart sounds and intact distal pulses.  Exam reveals no gallop and no friction rub.   No murmur heard. Pulmonary/Chest: Effort normal and breath sounds normal. No respiratory distress. She has no wheezes. She has no rales. She exhibits no tenderness.  Abdominal: Soft. She exhibits no distension. There is no tenderness. There is no guarding.  Musculoskeletal: Normal range of motion. She exhibits no edema.  Neurological: She is alert and oriented to person, place, and time.  Strength 5/5 throughout. No sensory deficits. No gait abnormality.   Skin: Skin is warm and dry. No rash noted. She is not diaphoretic. No erythema. No pallor.  Psychiatric: She has a normal mood and affect. Her behavior is normal.  Nursing note and vitals reviewed.    ED Treatments / Results    Labs (all labs ordered are listed, but only abnormal results are displayed) Labs Reviewed  BASIC METABOLIC PANEL - Abnormal; Notable for the following:       Result Value   Sodium 134 (*)    Chloride 100 (*)    Glucose, Bld 157 (*)    All other components within normal limits  D-DIMER, QUANTITATIVE (NOT AT [facility]) - Abnormal; Notable for the following:    D-Dimer, Quant 1.97 (*)    All other components within normal limits  CBC  DIFFERENTIAL  BRAIN NATRIURETIC PEPTIDE  I-STAT TROPOININ, ED    EKG  EKG Interpretation  Date/Time:  Thursday February 29 2016 12:52:05 EDT Ventricular Rate:  97 PR Interval:    QRS Duration: 78 QT Interval:  334 QTC Calculation: 425 R Axis:   19 Text Interpretation:  Sinus rhythm No prior EKG for comparison  Confirmed by LIU MD, DANA [phone]) on 02/29/2016 1:40:08 PM       Radiology Dg Chest 2 View  Result Date: 02/29/2016 CLINICAL DATA:  Shortness of breath and cough for 2 days EXAM: CHEST  2 VIEW COMPARISON:  Chest radiograph September 08, 2014; chest CT Dec 01, 2015 FINDINGS: There is no edema or consolidation. The heart size and pulmonary vascularity are normal. There is atherosclerotic calcification aortic arch region. No bone lesions. IMPRESSION: Aortic atherosclerosis.  No edema or consolidation. Electronically Signed   By: Bretta Bang III M.D.   On: 02/29/2016 13:29    Procedures Procedures (including critical care time)  Medications Ordered in ED Medications - No data to display   Initial Impression / Assessment and Plan / ED Course  I have reviewed the triage vital signs and the nursing notes.  Pertinent labs & imaging results that were available during my care of the patient were reviewed by me and considered in my medical decision making (see chart for details).  82 y.o F with pmhx of HTn presents to the ED today c/o ongoing SOB at rest over the last 5-6 days. On presentation to ED, pt appears well. In NAD. Mild tachycardia to  105. No hypoxia. No chest pain. No respiratory distress. EKG unremarkable. Initial trop 0.0. Low suspicion ACS. HEART score is 2. CXR reveals some aortic atherosclerosis but is otherwise unremarkable. BNP wnl.  Clinical Course  Comment By Time  D-dimer is elevated. Cannot age adjust, too high. Will obtain CT-PE study to r/o clot. Dub Mikes, PA-C 08/24 1506   Pt signed out to [facility] PA-C at shift change pending CT. If negative d/c to home and follow up with PCP and cardiologist. If positive, treat accordingly.   Patient was discussed with and seen by Dr. Verdie Mosher who agrees with the treatment plan.      Final Clinical Impressions(s) / ED Diagnoses   Final diagnoses:  SOB (shortness of breath)    New Prescriptions New Prescriptions   No medications on file     [facility]  Tia Maskerripp Jelicia Nantz, PA-C 02/29/16 1621    Lavera Guiseana Duo Liu, MD 02/29/16 [phone]

## 2016-02-29 NOTE — Progress Notes (Signed)
VASCULAR LAB PRELIMINARY  PRELIMINARY  PRELIMINARY  PRELIMINARY  Bilateral lower extremity venous duplex completed.    Preliminary report:  There is no obvious evidence of DVT or SVT noted in the bilateral lower extremity.  The left popliteal vein is mildly dilated with sluggish flow noted, but it is compressible and has adequate Doppler flow.  Christina Hobbs, RVT 02/29/2016, 7:50 PM

## 2016-02-29 NOTE — ED Triage Notes (Signed)
[facility]  Changes and shortness of breath. States it feels like I am trying to catch my breath, Im huffing and puffing. Pt a/o speaking in clear full sentences. Vitals stable.

## 2016-02-29 NOTE — Discharge Instructions (Signed)
Please follow up with your primary care physician at the next available appointment, preferably early next week.  Take Xarelto as directed on the package.  Return to ER for chest pain, difficulty breathing, new or worsening symptoms, any additional concerns.   Information on my medicine - XARELTO (rivaroxaban)  This medication education was reviewed with me or my healthcare representative as part of my discharge preparation.  The pharmacist that spoke with me during my hospital stay was:  Rollene FareWilliamson, Cleave Ternes R, [facility]  WHY WAS XARELTO PRESCRIBED FOR YOU? Xarelto was prescribed to treat blood clots that may have been found in the veins of your legs (deep vein thrombosis) or in your lungs (pulmonary embolism) and to reduce the risk of them occurring again.  What do you need to know about Xarelto? The starting dose is one 15 mg tablet taken TWICE daily with food for the FIRST 21 DAYS then on (enter date)  03/23/16  the dose is changed to one 20 mg tablet taken ONCE A DAY with your evening meal.  DO NOT stop taking Xarelto without talking to the health care provider who prescribed the medication.  Refill your prescription for 20 mg tablets before you run out.  After discharge, you should have regular check-up appointments with your healthcare provider that is prescribing your Xarelto.  In the future your dose may need to be changed if your kidney function changes by a significant amount.  What do you do if you miss a dose? If you are taking Xarelto TWICE DAILY and you miss a dose, take it as soon as you remember. You may take two 15 mg tablets (total 30 mg) at the same time then resume your regularly scheduled 15 mg twice daily the next day.  If you are taking Xarelto ONCE DAILY and you miss a dose, take it as soon as you remember on the same day then continue your regularly scheduled once daily regimen the next day. Do not take two doses of Xarelto at the same time.   Important Safety  Information Xarelto is a blood thinner medicine that can cause bleeding. You should call your healthcare provider right away if you experience any of the following: ? Bleeding from an injury or your nose that does not stop. ? Unusual colored urine (red or dark brown) or unusual colored stools (red or black). ? Unusual bruising for unknown reasons. ? A serious fall or if you hit your head (even if there is no bleeding).  Some medicines may interact with Xarelto and might increase your risk of bleeding while on Xarelto. To help avoid this, consult your healthcare provider or pharmacist prior to using any new prescription or non-prescription medications, including herbals, vitamins, non-steroidal anti-inflammatory drugs (NSAIDs) and supplements.  This website has more information on Xarelto: [email or web address].xarelto.[email or web address].

## 2016-03-14 ENCOUNTER — Other Ambulatory Visit: Payer: Self-pay

## 2016-04-03 DIAGNOSIS — R938 Abnormal findings on diagnostic imaging of other specified body structures: Secondary | ICD-10-CM | POA: Diagnosis not present

## 2016-04-03 DIAGNOSIS — I10 Essential (primary) hypertension: Secondary | ICD-10-CM | POA: Diagnosis not present

## 2016-04-03 DIAGNOSIS — R011 Cardiac murmur, unspecified: Secondary | ICD-10-CM | POA: Diagnosis not present

## 2016-04-03 DIAGNOSIS — R0981 Nasal congestion: Secondary | ICD-10-CM | POA: Diagnosis not present

## 2016-04-12 ENCOUNTER — Telehealth: Payer: Self-pay | Admitting: Cardiovascular Disease

## 2016-04-12 NOTE — Telephone Encounter (Signed)
[facility] with Dr [name].  [facility] for Dr Leonides Sakeandolph's schedule on 05/03/16. lp

## 2016-04-16 DIAGNOSIS — J3489 Other specified disorders of nose and nasal sinuses: Secondary | ICD-10-CM | POA: Diagnosis not present

## 2016-04-22 DIAGNOSIS — M170 Bilateral primary osteoarthritis of knee: Secondary | ICD-10-CM | POA: Diagnosis not present

## 2016-04-22 DIAGNOSIS — M2550 Pain in unspecified joint: Secondary | ICD-10-CM | POA: Diagnosis not present

## 2016-04-22 DIAGNOSIS — R700 Elevated erythrocyte sedimentation rate: Secondary | ICD-10-CM | POA: Diagnosis not present

## 2016-04-22 DIAGNOSIS — R768 Other specified abnormal immunological findings in serum: Secondary | ICD-10-CM | POA: Diagnosis not present

## 2016-04-22 DIAGNOSIS — I82532 Chronic embolism and thrombosis of left popliteal vein: Secondary | ICD-10-CM | POA: Diagnosis not present

## 2016-05-03 ENCOUNTER — Ambulatory Visit (INDEPENDENT_AMBULATORY_CARE_PROVIDER_SITE_OTHER): Payer: Medicare Other | Admitting: Cardiovascular Disease

## 2016-05-03 ENCOUNTER — Encounter: Payer: Self-pay | Admitting: Cardiovascular Disease

## 2016-05-03 VITALS — BP 179/82 | HR 89 | Ht 67.0 in | Wt 125.4 lb

## 2016-05-03 DIAGNOSIS — R011 Cardiac murmur, unspecified: Secondary | ICD-10-CM

## 2016-05-03 DIAGNOSIS — I10 Essential (primary) hypertension: Secondary | ICD-10-CM | POA: Diagnosis not present

## 2016-05-03 NOTE — Patient Instructions (Signed)
Medication Instructions:  Your physician recommends that you continue on your current medications as directed. Please refer to the Current Medication list given to you today.  Labwork: none  Testing/Procedures: Your physician has requested that you have an echocardiogram. Echocardiography is a painless test that uses sound waves to create images of your heart. It provides your doctor with information about the size and shape of your heart and how well your heart's chambers and valves are working. This procedure takes approximately one hour. There are no restrictions for this procedure. [facility] AT [address]   Follow-Up: Your physician recommends that you schedule a follow-up appointment in: 1 MONTH OV  Any Other Special Instructions Will Be Listed Below (If Applicable). START LOGGING YOUR BLOOD PRESSURES AT HOME AND BRING THEM TO YOUR FOLLOW UP VISIT   If you need a refill on your cardiac medications before your next appointment, please call your pharmacy.

## 2016-05-03 NOTE — Progress Notes (Signed)
Cardiology Office Note   Date:  05/05/2016   ID:  Christina DubinBarbara Hobbs, DOB 11/13/1930, MRN [id number]  PCP:  Christina HeronVictoria R Rankins, MD  Cardiologist:   Christina Siiffany Paxtang, MD   Chief Complaint  Patient presents with  . New Patient (Initial Visit)    Pt states no Sx.       History of Present Illness: Christina Hobbs is a 82 y.o. female with hypertension, diabetes, prior PE, and hypothyroidism who presents for evaluation of a murmur.  Christina Hobbs saw Dr. Benetta SparVictoria Hobbs on 04/03/16 and was noted to have a systolic murmur.  She doesn't recall being told that she had a murmur prior to that appointment.  She has ben feeling well and denies chest pain, shortness of breath, lower extremity edema, orthopnea or PND.  She notes mild swelling of her left foot that she attributes to arthritis.  Christina Hobbs checks her BP at home and it typically runs 120-140/60s.   She waslks for exercise 15-20 minute 4 days/week and has no exertional symptoms.  She had vertigo several months ago that has since improved.  Past Medical History:  Diagnosis Date  . Cardiac murmur   . Dizziness   . Hyperlipidemia   . Hypertension   . Pre-diabetes   . Syncope and collapse   . Thyroid disease   . Vitamin D deficiency     Past Surgical History:  Procedure Laterality Date  . TONSILLECTOMY       Current Outpatient Prescriptions  Medication Sig Dispense Refill  . amLODipine (NORVASC) 10 MG tablet Take 10 mg by mouth daily with breakfast.    . Cholecalciferol (VITAMIN D) 2000 units tablet Take 2,000 Units by mouth every evening.    . fluticasone (FLONASE) 50 MCG/ACT nasal spray Place 2 sprays into both nostrils as needed for allergies or rhinitis.    [name]. levothyroxine (SYNTHROID, LEVOTHROID) 50 MCG tablet Take 50 mcg by mouth daily before breakfast.    . metFORMIN (GLUCOPHAGE-XR) 500 MG 24 hr tablet Take 500 mg by mouth every evening.     No current facility-administered medications for this visit.     Allergies:    Zestril [lisinopril]; Avapro [irbesartan]; Norvasc [amlodipine besylate]; and Tenormin [atenolol]    Social History:  The patient  reports that she has never smoked. She has never used smokeless tobacco. She reports that she does not drink alcohol or use drugs.   Family History:  The patient's family history includes Alzheimer's disease in her father; Stroke in her mother.    ROS:  Please see the history of present illness.   Otherwise, review of systems are positive for none.   All other systems are reviewed and negative.    PHYSICAL EXAM: VS:  BP (!) 179/82   Pulse 89   Ht 5\' 7"  (1.702 m)   Wt 56.9 kg (125 lb 6.4 oz)   BMI 19.64 kg/m  , BMI Body mass index is 19.64 kg/m. GENERAL:  Well appearing HEENT:  Pupils equal round and reactive, fundi not visualized, oral mucosa unremarkable NECK:  No jugular venous distention, waveform within normal limits, carotid upstroke brisk and symmetric, no bruits, no thyromegaly LYMPHATICS:  No cervical adenopathy LUNGS:  Clear to auscultation bilaterally HEART:  RRR.  PMI not displaced or sustained,S1 and S2 within normal limits, no S3, no S4, no clicks, no rubs, II/VI systolic murmur at LUSB ABD:  Flat, positive bowel sounds normal in frequency in pitch, no bruits, no rebound, no guarding, no midline  pulsatile mass, no hepatomegaly, no splenomegaly EXT:  2 plus pulses throughout, no edema, no cyanosis no clubbing SKIN:  No rashes no nodules NEURO:  Cranial nerves II through XII grossly intact, motor grossly intact throughout PSYCH:  Cognitively intact, oriented to person place and time    EKG:  EKG is ordered today. The ekg ordered today demonstrates sinus rhythm rate 89 bpm.     Recent Labs: 02/29/2016: B Natriuretic Peptide 38.0; BUN 17; Creatinine, Ser 0.52; Hemoglobin 12.3; Platelets 336; Potassium 3.6; Sodium 134   02/20/16: Hemoglobin A1c 7.3% WBC 9.0, hemoglobin 13.0, hematocrit 40.2, platelets 373 BUN 20, creatinine 0.58, sodium  138, potassium 4.4 AST 15, ALT 8 TSH 1.36  Lipid Panel No results found for: CHOL, TRIG, HDL, CHOLHDL, VLDL, LDLCALC, LDLDIRECT    Wt Readings from Last 3 Encounters:  05/03/16 56.9 kg (125 lb 6.4 oz)  05/23/08 70.8 kg (156 lb)      ASSESSMENT AND PLAN:  # Murmur:  Christina Hobbs has a murmur consistent with mild aortic stenosis.  We will get an echo to evaluate.   # Hypertension: She reports that her blood pressure is well-controlled at home.  I have asked her to keep a log of her blood pressures and bring her machine to her follow up appointment.  Continue amlodipine.  Repeat BP was 164/86.   Current medicines are reviewed at length with the patient today.  The patient does not have concerns regarding medicines.  The following changes have been made:  no change  Labs/ tests ordered today include:   Orders Placed This Encounter  Procedures  . EKG 12-Lead  . ECHOCARDIOGRAM COMPLETE     Disposition:   FU with Christina Hobbs C. Christina Salvia, MD, [facility] in 1 month    This note was written with the assistance of speech recognition software.  Please excuse any transcriptional errors.  Signed, Kaisley Stiverson C. Christina Salvia, MD, [facility]  05/05/2016 8:29 PM     [facility]

## 2016-05-22 ENCOUNTER — Other Ambulatory Visit: Payer: Self-pay

## 2016-05-22 ENCOUNTER — Ambulatory Visit (HOSPITAL_COMMUNITY): Payer: Medicare Other | Attending: Cardiology

## 2016-05-22 DIAGNOSIS — I071 Rheumatic tricuspid insufficiency: Secondary | ICD-10-CM | POA: Insufficient documentation

## 2016-05-22 DIAGNOSIS — R011 Cardiac murmur, unspecified: Secondary | ICD-10-CM | POA: Diagnosis not present

## 2016-05-27 ENCOUNTER — Encounter: Payer: Self-pay | Admitting: Cardiology

## 2016-05-27 ENCOUNTER — Ambulatory Visit (INDEPENDENT_AMBULATORY_CARE_PROVIDER_SITE_OTHER): Payer: Medicare Other | Admitting: Cardiology

## 2016-05-27 ENCOUNTER — Ambulatory Visit: Payer: Medicare Other | Admitting: Cardiovascular Disease

## 2016-05-27 VITALS — BP 195/90 | HR 90 | Ht 67.0 in | Wt 123.0 lb

## 2016-05-27 DIAGNOSIS — R011 Cardiac murmur, unspecified: Secondary | ICD-10-CM | POA: Diagnosis not present

## 2016-05-27 DIAGNOSIS — I10 Essential (primary) hypertension: Secondary | ICD-10-CM

## 2016-05-27 DIAGNOSIS — E119 Type 2 diabetes mellitus without complications: Secondary | ICD-10-CM | POA: Diagnosis not present

## 2016-05-27 MED ORDER — LOSARTAN POTASSIUM 25 MG PO TABS: 25.0000 mg | ORAL_TABLET | Freq: Every day | ORAL | 5 refills | Status: DC

## 2016-05-27 MED ORDER — DICLOFENAC SODIUM 1 % TD GEL: 2.0000 g | Freq: Three times a day (TID) | TRANSDERMAL | 0 refills | Status: DC | PRN

## 2016-05-27 NOTE — Assessment & Plan Note (Signed)
No significant valvular abnormality on echo

## 2016-05-27 NOTE — Assessment & Plan Note (Signed)
Followed by PCP- on Metformin 

## 2016-05-27 NOTE — Progress Notes (Signed)
    05/27/2016 Christina Hobbs   January 17, 1931  [id number]  Primary Physician Christina HeronVictoria R Rankins, MD Primary Cardiologist: Dr Christina Hobbs  HPI:  82 y/o female referred to Dr Christina Hobbs for heart murmur. Echo done 05/22/16 showed an EF of 60-65% with a mildly thickened AOV- no AS. The pt is hypertensive and is a diabetic. She lists multiple drug intolerances but when questioned about some of these reactions she admits it may have been her being,  in her own words "oversensative" about taking medications. Her B/P readings from home show a B/P of 144/88.    Current Outpatient Prescriptions  Medication Sig Dispense Refill  . amLODipine (NORVASC) 10 MG tablet Take 10 mg by mouth daily with breakfast.    . Cholecalciferol (VITAMIN D) 2000 units tablet Take 2,000 Units by mouth every evening.    . fluticasone (FLONASE) 50 MCG/ACT nasal spray Place 2 sprays into both nostrils as needed for allergies or rhinitis.    [name]. levothyroxine (SYNTHROID, LEVOTHROID) 50 MCG tablet Take 50 mcg by mouth daily before breakfast.    . metFORMIN (GLUCOPHAGE-XR) 500 MG 24 hr tablet Take 500 mg by mouth every evening.     No current facility-administered medications for this visit.     Allergies  Allergen Reactions  . Zestril [Lisinopril] Shortness Of Breath  . Avapro [Irbesartan] Other (See Comments)    myalgias  . Norvasc [Amlodipine Besylate] Itching  . Tenormin [Atenolol] Other (See Comments)    fatigue    Social History   Social History  . Marital status: Single    Spouse name: [name]  . Number of children: [name]  . Years of education: [name]   Occupational History  . Not on file.   Social History Main Topics  . Smoking status: Never Smoker  . Smokeless tobacco: Never Used  . Alcohol use No  . Drug use: No  . Sexual activity: Not on file   Other Topics Concern  . Not on file   Social History Narrative  . No narrative on file     Review of Systems: General: negative for chills, fever, night sweats or  weight changes.  Cardiovascular: negative for chest pain, dyspnea on exertion, edema, orthopnea, palpitations, paroxysmal nocturnal dyspnea or shortness of breath Dermatological: negative for rash Respiratory: negative for cough or wheezing Urologic: negative for hematuria Abdominal: negative for nausea, vomiting, diarrhea, bright red blood per rectum, melena, or hematemesis Neurologic: negative for visual changes, syncope, or dizziness DJD knees All other systems reviewed and are otherwise negative except as noted above.    Blood pressure (!) 195/90, pulse 90, height 5\' 7"  (1.702 m), weight 123 lb (55.8 kg).  General appearance: alert, cooperative and no distress Neck: no carotid bruit and no JVD Lungs: clear to auscultation bilaterally Heart: regular rate and rhythm and soft systolic murmur AOV Extremities: extremities normal, atraumatic, no cyanosis or edema Skin: Skin color, texture, turgor normal. No rashes or lesions Neurologic: Grossly normal   ASSESSMENT AND PLAN:   Uncontrolled hypertension B/P 172/72 by me  Non-insulin treated type 2 diabetes mellitus (HCC) Followed by PCP- on Metformin  Systolic murmur No significant valvular abnormality on echo   PLAN  I suggested we try Cozaar 25 mg daily. She is to f/u with Dr Christina Hobbs next month. We can see her prn, further medication adjustment by Dr Christina Hobbs.   Corine ShelterLuke Rishav Rockefeller PA-C 05/27/2016 3:19 PM

## 2016-05-27 NOTE — Patient Instructions (Addendum)
Medication Instructions:  Your physician has recommended you make the following change in your medication:  1.  START Cozaar 25 mg taking 1 tablet daily   Labwork: None ordered  Testing/Procedures: None ordered  Follow-Up: Your physician recommends that you schedule a follow-up appointment in: AS NEEDED   Any Other Special Instructions Will Be Listed Below (If Applicable).     If you need a refill on your cardiac medications before your next appointment, please call your pharmacy.

## 2016-05-27 NOTE — Assessment & Plan Note (Signed)
B/P 172/72 by me

## 2016-05-29 ENCOUNTER — Telehealth: Payer: Self-pay | Admitting: Cardiovascular Disease

## 2016-05-29 NOTE — Telephone Encounter (Signed)
New Message  Pt c/o medication issue:  1. Name of Medication: losartan  2. How are you currently taking this medication (dosage and times per day)? (Cozaar) 25 mg tablet  3. Are you having a reaction (difficulty breathing--STAT)? No  4. What is your medication issue? pt voiced several years ago pt has problem with this medication due to high BP, pt voiced she needs to go back on amlodipine.  Pt voiced she didn't know what she was picking up until he got home.  Pt voiced she is going back to amlodipine.  Please f/u with pt

## 2016-05-29 NOTE — Telephone Encounter (Signed)
I returned call to patient. Spoke to her. She clarified that she was concernd that several years ago, while on losartan (which she had taken for months at that point), she had a 1 time event of her BP spiking to 200 systolic, and that she was symptomatic w dizziness, had to go to hospital to have BP brought down.  She was seen on Monday by Franky MachoLuke. Has been on amlodipine and instruction was given to start the losartan 25mg , but not to discontinue any medications. However, patient interpreted instructions to mean that she should discontinue the amlodipine and start losartan. I have given her clarification regarding this - she is now aware she is supposed to take both. She will plan to resume amlodipine tomorrow AM (has missed 2 days worth of doses) and follow BPs for several days and notify if any concerns. Ultimately she is aware she should give the med changes a few days to work, and continue to keep a log for a couple of weeks. She's aware I will route to pharmD to review. If nothing urgent is advised/no alterations to plan as is, she would like to keep BP log at this point and call us in 2-3 weeks w her trends.  She also notes that though her BP is elevated typically in the office, it is better controlled at home.

## 2016-05-29 NOTE — Telephone Encounter (Signed)
Agree with assessment. 

## 2016-05-29 NOTE — Telephone Encounter (Signed)
Patient aware of advice. OK to close. Will follow up w patient as indicated.

## 2016-06-10 DIAGNOSIS — Z7984 Long term (current) use of oral hypoglycemic drugs: Secondary | ICD-10-CM | POA: Diagnosis not present

## 2016-06-10 DIAGNOSIS — H6123 Impacted cerumen, bilateral: Secondary | ICD-10-CM | POA: Diagnosis not present

## 2016-06-10 DIAGNOSIS — E119 Type 2 diabetes mellitus without complications: Secondary | ICD-10-CM | POA: Diagnosis not present

## 2016-06-10 DIAGNOSIS — I10 Essential (primary) hypertension: Secondary | ICD-10-CM | POA: Diagnosis not present

## 2016-06-10 DIAGNOSIS — M1990 Unspecified osteoarthritis, unspecified site: Secondary | ICD-10-CM | POA: Diagnosis not present

## 2016-06-10 DIAGNOSIS — H6120 Impacted cerumen, unspecified ear: Secondary | ICD-10-CM | POA: Diagnosis not present

## 2016-06-10 DIAGNOSIS — E039 Hypothyroidism, unspecified: Secondary | ICD-10-CM | POA: Diagnosis not present

## 2016-06-10 DIAGNOSIS — Z23 Encounter for immunization: Secondary | ICD-10-CM | POA: Diagnosis not present

## 2016-08-12 ENCOUNTER — Encounter (HOSPITAL_COMMUNITY): Payer: Self-pay | Admitting: Emergency Medicine

## 2016-08-12 ENCOUNTER — Emergency Department (HOSPITAL_COMMUNITY)
Admission: EM | Admit: 2016-08-12 | Discharge: 2016-08-12 | Disposition: A | Discharge status: Home / Self Care | Payer: Medicare Other | Attending: Emergency Medicine | Admitting: Emergency Medicine

## 2016-08-12 DIAGNOSIS — J45909 Unspecified asthma, uncomplicated: Secondary | ICD-10-CM | POA: Diagnosis not present

## 2016-08-12 DIAGNOSIS — R4182 Altered mental status, unspecified: Secondary | ICD-10-CM | POA: Diagnosis present

## 2016-08-12 DIAGNOSIS — R410 Disorientation, unspecified: Secondary | ICD-10-CM | POA: Insufficient documentation

## 2016-08-12 DIAGNOSIS — Z7984 Long term (current) use of oral hypoglycemic drugs: Secondary | ICD-10-CM | POA: Insufficient documentation

## 2016-08-12 DIAGNOSIS — I10 Essential (primary) hypertension: Secondary | ICD-10-CM | POA: Insufficient documentation

## 2016-08-12 DIAGNOSIS — E039 Hypothyroidism, unspecified: Secondary | ICD-10-CM | POA: Diagnosis not present

## 2016-08-12 DIAGNOSIS — N390 Urinary tract infection, site not specified: Secondary | ICD-10-CM | POA: Diagnosis not present

## 2016-08-12 LAB — BASIC METABOLIC PANEL
Anion gap: 9 (ref 5–15)
BUN: 27 mg/dL — ABNORMAL HIGH (ref 6–20)
CO2: 29 mmol/L (ref 22–32)
Calcium: 9.6 mg/dL (ref 8.9–10.3)
Chloride: 100 mmol/L — ABNORMAL LOW (ref 101–111)
Creatinine, Ser: 0.91 mg/dL (ref 0.44–1.00)
GFR calc Af Amer: >60 mL/min (ref >60)
GFR calc non Af Amer: 57 mL/min — ABNORMAL LOW (ref >60)
Glucose, Bld: 158 mg/dL — ABNORMAL HIGH (ref 65–99)
Potassium: 3.3 mmol/L — ABNORMAL LOW (ref 3.5–5.1)
Sodium: 138 mmol/L (ref 135–145)

## 2016-08-12 LAB — CBC WITH DIFFERENTIAL/PLATELET
Basophils Absolute: 0 10*3/uL (ref 0.0–0.1)
Basophils Relative: 0 %
Eosinophils Absolute: 0 10*3/uL (ref 0.0–0.7)
Eosinophils Relative: 0 %
HCT: 38.9 % (ref 36.0–46.0)
Hemoglobin: 12.7 g/dL (ref 12.0–15.0)
Lymphocytes Relative: 24 %
Lymphs Abs: 2 10*3/uL (ref 0.7–4.0)
MCH: 28.7 pg (ref 26.0–34.0)
MCHC: 32.6 g/dL (ref 30.0–36.0)
MCV: 87.8 fL (ref 78.0–100.0)
Monocytes Absolute: 0.4 10*3/uL (ref 0.1–1.0)
Monocytes Relative: 5 %
Neutro Abs: 6.1 10*3/uL (ref 1.7–7.7)
Neutrophils Relative %: 71 %
Platelets: 319 10*3/uL (ref 150–400)
RBC: 4.43 MIL/uL (ref 3.87–5.11)
RDW: 14.3 % (ref 11.5–15.5)
WBC: 8.6 10*3/uL (ref 4.0–10.5)

## 2016-08-12 LAB — URINALYSIS, ROUTINE W REFLEX MICROSCOPIC
Bacteria, UA: NONE SEEN
Bilirubin Urine: NEGATIVE
Glucose, UA: NEGATIVE mg/dL
Ketones, ur: NEGATIVE mg/dL
Nitrite: NEGATIVE
Protein, ur: NEGATIVE mg/dL
Specific Gravity, Urine: 1.021 (ref 1.005–1.030)
pH: 5 (ref 5.0–8.0)

## 2016-08-12 MED ORDER — CEPHALEXIN 500 MG PO CAPS
500.0000 mg | ORAL_CAPSULE | Freq: Once | ORAL | Status: AC
  Administered 2016-08-12: 500 mg via ORAL
  Filled 2016-08-12: qty 1

## 2016-08-12 MED ORDER — CEPHALEXIN 500 MG PO CAPS: 500.0000 mg | ORAL_CAPSULE | Freq: Three times a day (TID) | ORAL | 0 refills | Status: DC

## 2016-08-12 NOTE — ED Notes (Signed)
Bed: WA02 Expected date:  Expected time:  Means of arrival:  Comments: Triage  

## 2016-08-12 NOTE — ED Provider Notes (Signed)
[facility]-EMERGENCY DEPT Provider Note   CSN: [id number] Arrival date & time: 08/12/16  1151   By signing my name below, I, Christina Hobbs, attest that this documentation has been prepared under the direction and in the presence of Raeford RazorStephen Jesson Foskey, MD . Electronically Signed: Freida Busmaniana Hobbs, Scribe. 08/12/2016. 12:29 PM.  History   Chief Complaint Chief Complaint  Patient presents with  . Altered Mental Status   LEVEL 5 CAVEAT DUE TO Confusion   The history is provided by the patient and a relative (sister). No language interpreter was used.   HPI Comments:  Christina DubinBarbara Hobbs is a 83 y.o. female who presents to the Emergency Department with her sister. At this time pt has no physical complaints or symptoms; states she feels fine. Pt's sister reports acute confusion for the last 2 days. The pt's friend called the pt's sister as they were also concerned about the pt's mental status. Sister also notes the pt has a new upstairs neighbor and that has agitated the pt. The pt is normally very independent per sister. No recent falls. No urinary symptoms. No recent change in medications per pt.    Past Medical History:  Diagnosis Date  . Cardiac murmur   . Dizziness   . Hyperlipidemia   . Hypertension   . Pre-diabetes   . Syncope and collapse   . Thyroid disease   . Vitamin D deficiency     Patient Active Problem List   Diagnosis Date Noted  . Non-insulin treated type 2 diabetes mellitus (HCC) 05/27/2016  . Systolic murmur 05/27/2016  . DYSPNEA 05/23/2008  . HYPOTHYROIDISM 05/20/2008  . Uncontrolled hypertension 05/20/2008  . ASTHMA 05/20/2008    Past Surgical History:  Procedure Laterality Date  . TONSILLECTOMY      OB History    No data available       Home Medications    Prior to Admission medications   Medication Sig Start Date End Date Taking? Authorizing Provider  amLODipine (NORVASC) 10 MG tablet Take 10 mg by mouth daily with breakfast.    Historical Provider, MD    Cholecalciferol (VITAMIN D) 2000 units tablet Take 2,000 Units by mouth every evening.    Historical Provider, MD  diclofenac sodium (VOLTAREN) 1 % GEL Apply 2 g topically 3 (three) times daily as needed (PAIN). 05/27/16   Abelino DerrickLuke K Kilroy, PA-C  fluticasone (FLONASE) 50 MCG/ACT nasal spray Place 2 sprays into both nostrils as needed for allergies or rhinitis.    Historical Provider, MD  levothyroxine (SYNTHROID, LEVOTHROID) 50 MCG tablet Take 50 mcg by mouth daily before breakfast.    Historical Provider, MD  losartan (COZAAR) 25 MG tablet Take 1 tablet (25 mg total) by mouth daily. 05/27/16 08/25/16  Abelino DerrickLuke K Kilroy, PA-C  metFORMIN (GLUCOPHAGE-XR) 500 MG 24 hr tablet Take 500 mg by mouth every evening.    Historical Provider, MD    Family History Family History  Problem Relation Age of Onset  . Stroke Mother   . Alzheimer's disease Father     Social History Social History  Substance Use Topics  . Smoking status: Never Smoker  . Smokeless tobacco: Never Used  . Alcohol use No     Allergies   Zestril [lisinopril]; Norvasc [amlodipine besylate]; and Tenormin [atenolol]   Review of Systems Review of Systems  Reason unable to perform ROS: Confusion.    Physical Exam Updated Vital Signs BP 119/74 (BP Location: Right Arm)   Pulse 75   Temp 98.1 F (36.7 C)  Resp 18   SpO2 100%   Physical Exam  Constitutional: She appears well-developed and well-nourished. No distress.  HENT:  Head: Normocephalic and atraumatic.  Eyes: EOM are normal.  Neck: Normal range of motion.  Cardiovascular: Normal rate, regular rhythm and normal heart sounds.   Pulmonary/Chest: Effort normal and breath sounds normal.  Abdominal: Soft. She exhibits no distension. There is no tenderness.  Musculoskeletal: Normal range of motion.  Neurological: She is alert.  Skin: Skin is warm and dry.  Psychiatric: She has a normal mood and affect. Her speech is tangential.  Rambling speech  Tangential thoughts   Nursing note and vitals reviewed.   ED Treatments / Results  DIAGNOSTIC STUDIES:  Oxygen Saturation is 100% on RA, normal by my interpretation.    COORDINATION OF CARE:  12:24 PM Discussed treatment plan with pt and family at bedside and they agreed to plan.  Labs (all labs ordered are listed, but only abnormal results are displayed) Labs Reviewed  URINALYSIS, ROUTINE W REFLEX MICROSCOPIC - Abnormal; Notable for the following:       Result Value   APPearance HAZY (*)    Hgb urine dipstick SMALL (*)    Leukocytes, UA LARGE (*)    Squamous Epithelial / LPF 0-5 (*)    All other components within normal limits  BASIC METABOLIC PANEL - Abnormal; Notable for the following:    Potassium 3.3 (*)    Chloride 100 (*)    Glucose, Bld 158 (*)    BUN 27 (*)    GFR calc non Af Amer 57 (*)    All other components within normal limits  URINE CULTURE  CBC WITH DIFFERENTIAL/PLATELET    EKG  EKG Interpretation None       Radiology No results found.     Procedures Procedures (including critical care time)  Medications Ordered in ED Medications - No data to display   Initial Impression / Assessment and Plan / ED Course  I have reviewed the triage vital signs and the nursing notes.  Pertinent labs & imaging results that were available during my care of the patient were reviewed by me and considered in my medical decision making (see chart for details).    85yF with confusion. Possibly from UTI although UA not overly impressive. WIll tx. She is HD stable.   Final Clinical Impressions(s) / ED Diagnoses   Final diagnoses:  Confusion  Urinary tract infection without hematuria, site unspecified    New Prescriptions New Prescriptions   No medications on file   I personally preformed the services scribed in my presence. The recorded information has been reviewed is accurate. Raeford Razor, MD.     Raeford Razor, MD 08/18/16 [phone]

## 2016-08-12 NOTE — ED Triage Notes (Signed)
Patient's sister states that patient lives by herself and she last spoke with her last weekend and patient was fine.  A friend was talking with patient on Friday and noticed patient was forgetful and not at her baseline.  Patient's sister tried calling patient over the weekend and couldn't get a hold of her on the phone so she called 911.  EMS went out for wellness check yesterday and patient refused to go with them but felt patient needed to be seen.  [place] and came today to bring patient here for evaluation sue to confusion and unable to finish sentences or thoughts.  Sister unsure of when patient last ate.

## 2016-08-13 LAB — URINE CULTURE: Culture: NO GROWTH

## 2016-08-22 DIAGNOSIS — N390 Urinary tract infection, site not specified: Secondary | ICD-10-CM | POA: Diagnosis not present

## 2016-10-05 ENCOUNTER — Encounter (HOSPITAL_COMMUNITY): Payer: Self-pay | Admitting: Emergency Medicine

## 2016-10-05 ENCOUNTER — Emergency Department (HOSPITAL_COMMUNITY): Payer: Medicare Other

## 2016-10-05 ENCOUNTER — Inpatient Hospital Stay (HOSPITAL_COMMUNITY)
Admission: EM | Admit: 2016-10-05 | Discharge: 2016-10-11 | DRG: 545 | Disposition: A | Discharge status: Skilled Nursing Facility | Payer: Medicare Other | Attending: Nephrology | Admitting: Nephrology

## 2016-10-05 ENCOUNTER — Observation Stay (HOSPITAL_COMMUNITY): Payer: Medicare Other

## 2016-10-05 DIAGNOSIS — I10 Essential (primary) hypertension: Secondary | ICD-10-CM | POA: Diagnosis not present

## 2016-10-05 DIAGNOSIS — R40241 Glasgow coma scale score 13-15, unspecified time: Secondary | ICD-10-CM | POA: Diagnosis present

## 2016-10-05 DIAGNOSIS — R011 Cardiac murmur, unspecified: Secondary | ICD-10-CM | POA: Diagnosis present

## 2016-10-05 DIAGNOSIS — R410 Disorientation, unspecified: Secondary | ICD-10-CM | POA: Diagnosis not present

## 2016-10-05 DIAGNOSIS — W19XXXA Unspecified fall, initial encounter: Secondary | ICD-10-CM | POA: Diagnosis present

## 2016-10-05 DIAGNOSIS — E854 Organ-limited amyloidosis: Secondary | ICD-10-CM | POA: Diagnosis not present

## 2016-10-05 DIAGNOSIS — G9340 Encephalopathy, unspecified: Secondary | ICD-10-CM | POA: Diagnosis present

## 2016-10-05 DIAGNOSIS — E559 Vitamin D deficiency, unspecified: Secondary | ICD-10-CM | POA: Diagnosis present

## 2016-10-05 DIAGNOSIS — E871 Hypo-osmolality and hyponatremia: Secondary | ICD-10-CM | POA: Diagnosis present

## 2016-10-05 DIAGNOSIS — E119 Type 2 diabetes mellitus without complications: Secondary | ICD-10-CM | POA: Diagnosis not present

## 2016-10-05 DIAGNOSIS — Z79899 Other long term (current) drug therapy: Secondary | ICD-10-CM

## 2016-10-05 DIAGNOSIS — G936 Cerebral edema: Secondary | ICD-10-CM

## 2016-10-05 DIAGNOSIS — E785 Hyperlipidemia, unspecified: Secondary | ICD-10-CM | POA: Diagnosis present

## 2016-10-05 DIAGNOSIS — F0280 Dementia in other diseases classified elsewhere without behavioral disturbance: Secondary | ICD-10-CM | POA: Diagnosis present

## 2016-10-05 DIAGNOSIS — I680 Cerebral amyloid angiopathy: Secondary | ICD-10-CM | POA: Diagnosis present

## 2016-10-05 DIAGNOSIS — Z888 Allergy status to other drugs, medicaments and biological substances status: Secondary | ICD-10-CM

## 2016-10-05 DIAGNOSIS — R4189 Other symptoms and signs involving cognitive functions and awareness: Secondary | ICD-10-CM

## 2016-10-05 DIAGNOSIS — E1165 Type 2 diabetes mellitus with hyperglycemia: Secondary | ICD-10-CM | POA: Diagnosis present

## 2016-10-05 DIAGNOSIS — T380X5A Adverse effect of glucocorticoids and synthetic analogues, initial encounter: Secondary | ICD-10-CM | POA: Diagnosis present

## 2016-10-05 DIAGNOSIS — Z794 Long term (current) use of insulin: Secondary | ICD-10-CM

## 2016-10-05 DIAGNOSIS — E039 Hypothyroidism, unspecified: Secondary | ICD-10-CM | POA: Diagnosis present

## 2016-10-05 DIAGNOSIS — E079 Disorder of thyroid, unspecified: Secondary | ICD-10-CM | POA: Insufficient documentation

## 2016-10-05 DIAGNOSIS — N390 Urinary tract infection, site not specified: Secondary | ICD-10-CM

## 2016-10-05 DIAGNOSIS — G94 Other disorders of brain in diseases classified elsewhere: Secondary | ICD-10-CM

## 2016-10-05 DIAGNOSIS — E038 Other specified hypothyroidism: Secondary | ICD-10-CM | POA: Diagnosis not present

## 2016-10-05 DIAGNOSIS — E876 Hypokalemia: Secondary | ICD-10-CM | POA: Diagnosis present

## 2016-10-05 DIAGNOSIS — B9620 Unspecified Escherichia coli [E. coli] as the cause of diseases classified elsewhere: Secondary | ICD-10-CM | POA: Diagnosis present

## 2016-10-05 DIAGNOSIS — S299XXA Unspecified injury of thorax, initial encounter: Secondary | ICD-10-CM | POA: Diagnosis not present

## 2016-10-05 DIAGNOSIS — F4489 Other dissociative and conversion disorders: Secondary | ICD-10-CM | POA: Diagnosis not present

## 2016-10-05 DIAGNOSIS — R7303 Prediabetes: Secondary | ICD-10-CM

## 2016-10-05 LAB — URINALYSIS, ROUTINE W REFLEX MICROSCOPIC
BILIRUBIN URINE: NEGATIVE
Glucose, UA: NEGATIVE mg/dL
KETONES UR: 20 mg/dL — AB
Nitrite: NEGATIVE
PROTEIN: 30 mg/dL — AB
Specific Gravity, Urine: 1.013 (ref 1.005–1.030)
pH: 5 (ref 5.0–8.0)

## 2016-10-05 LAB — GLUCOSE, CAPILLARY: GLUCOSE-CAPILLARY: 160 mg/dL — AB (ref 65–99)

## 2016-10-05 LAB — COMPREHENSIVE METABOLIC PANEL
ALK PHOS: 76 U/L (ref 38–126)
ALT: 11 U/L — AB (ref 14–54)
AST: 23 U/L (ref 15–41)
Albumin: 3.7 g/dL (ref 3.5–5.0)
Anion gap: 11 (ref 5–15)
BUN: 29 mg/dL — AB (ref 6–20)
CALCIUM: 9.2 mg/dL (ref 8.9–10.3)
CHLORIDE: 100 mmol/L — AB (ref 101–111)
CO2: 23 mmol/L (ref 22–32)
CREATININE: 0.87 mg/dL (ref 0.44–1.00)
GFR calc non Af Amer: 59 mL/min — ABNORMAL LOW (ref >60)
Glucose, Bld: 137 mg/dL — ABNORMAL HIGH (ref 65–99)
Potassium: 3.6 mmol/L (ref 3.5–5.1)
SODIUM: 134 mmol/L — AB (ref 135–145)
Total Bilirubin: 0.8 mg/dL (ref 0.3–1.2)
Total Protein: 6.9 g/dL (ref 6.5–8.1)

## 2016-10-05 LAB — CBC WITH DIFFERENTIAL/PLATELET
BASOS ABS: 0 10*3/uL (ref 0.0–0.1)
Basophils Relative: 0 %
EOS ABS: 0 10*3/uL (ref 0.0–0.7)
Eosinophils Relative: 0 %
HCT: 36.3 % (ref 36.0–46.0)
Hemoglobin: 11.8 g/dL — ABNORMAL LOW (ref 12.0–15.0)
LYMPHS ABS: 2 10*3/uL (ref 0.7–4.0)
LYMPHS PCT: 17 %
MCH: 28.2 pg (ref 26.0–34.0)
MCHC: 32.5 g/dL (ref 30.0–36.0)
MCV: 86.6 fL (ref 78.0–100.0)
Monocytes Absolute: 0.7 10*3/uL (ref 0.1–1.0)
Monocytes Relative: 6 %
NEUTROS PCT: 77 %
Neutro Abs: 8.7 10*3/uL — ABNORMAL HIGH (ref 1.7–7.7)
Platelets: 281 10*3/uL (ref 150–400)
RBC: 4.19 MIL/uL (ref 3.87–5.11)
RDW: 14.8 % (ref 11.5–15.5)
WBC: 11.3 10*3/uL — AB (ref 4.0–10.5)

## 2016-10-05 LAB — I-STAT CG4 LACTIC ACID, ED
Lactic Acid, Venous: 1.19 mmol/L (ref 0.5–1.9)
Lactic Acid, Venous: 0.7 mmol/L (ref 0.5–1.9)

## 2016-10-05 LAB — TSH: TSH: 0.493 u[IU]/mL (ref 0.350–4.500)

## 2016-10-05 LAB — I-STAT TROPONIN, ED: TROPONIN I, POC: 0.02 ng/mL (ref 0.00–0.08)

## 2016-10-05 MED ORDER — ACETAMINOPHEN 325 MG PO TABS: 650.0000 mg | ORAL_TABLET | Freq: Four times a day (QID) | ORAL | Status: DC | PRN

## 2016-10-05 MED ORDER — DEXTROSE 5 % IV SOLN
1.0000 g | Freq: Once | INTRAVENOUS | Status: AC
  Administered 2016-10-05: 1 g via INTRAVENOUS
  Filled 2016-10-05: qty 10

## 2016-10-05 MED ORDER — LEVOTHYROXINE SODIUM 50 MCG PO TABS
50.0000 ug | ORAL_TABLET | Freq: Every day | ORAL | Status: DC
  Administered 2016-10-06 – 2016-10-11 (×6): 50 ug via ORAL
  Filled 2016-10-05 (×6): qty 1

## 2016-10-05 MED ORDER — FLUTICASONE PROPIONATE 50 MCG/ACT NA SUSP
2.0000 | NASAL | Status: DC | PRN
  Filled 2016-10-05: qty 16

## 2016-10-05 MED ORDER — ACETAMINOPHEN 650 MG RE SUPP: 650.0000 mg | Freq: Four times a day (QID) | RECTAL | Status: DC | PRN

## 2016-10-05 MED ORDER — VITAMIN D 1000 UNITS PO TABS
2000.0000 [IU] | ORAL_TABLET | Freq: Every evening | ORAL | Status: DC
  Administered 2016-10-05 – 2016-10-10 (×6): 2000 [IU] via ORAL
  Filled 2016-10-05 (×6): qty 2

## 2016-10-05 MED ORDER — SODIUM CHLORIDE 0.9 % IV SOLN
INTRAVENOUS | Status: DC
  Administered 2016-10-05: 22:00:00 via INTRAVENOUS

## 2016-10-05 MED ORDER — LORAZEPAM 0.5 MG PO TABS
0.5000 mg | ORAL_TABLET | Freq: Once | ORAL | Status: AC
  Administered 2016-10-05: 0.5 mg via ORAL
  Filled 2016-10-05: qty 1

## 2016-10-05 MED ORDER — ENOXAPARIN SODIUM 40 MG/0.4ML ~~LOC~~ SOLN
40.0000 mg | SUBCUTANEOUS | Status: DC
  Administered 2016-10-05: 40 mg via SUBCUTANEOUS
  Filled 2016-10-05: qty 0.4

## 2016-10-05 MED ORDER — INSULIN ASPART 100 UNIT/ML ~~LOC~~ SOLN
0.0000 [IU] | Freq: Three times a day (TID) | SUBCUTANEOUS | Status: DC
Start: 2016-10-06 — End: 2016-10-07
  Administered 2016-10-06: 2 [IU] via SUBCUTANEOUS
  Administered 2016-10-06: 3 [IU] via SUBCUTANEOUS

## 2016-10-05 MED ORDER — POLYETHYLENE GLYCOL 3350 17 G PO PACK: 17.0000 g | PACK | Freq: Every day | ORAL | Status: DC | PRN

## 2016-10-05 MED ORDER — AMLODIPINE BESYLATE 10 MG PO TABS
10.0000 mg | ORAL_TABLET | Freq: Every day | ORAL | Status: DC
Start: 2016-10-06 — End: 2016-10-11
  Administered 2016-10-07 – 2016-10-11 (×5): 10 mg via ORAL
  Filled 2016-10-05 (×6): qty 1

## 2016-10-05 MED ORDER — SODIUM CHLORIDE 0.9% FLUSH
3.0000 mL | Freq: Two times a day (BID) | INTRAVENOUS | Status: DC
  Administered 2016-10-05 – 2016-10-11 (×9): 3 mL via INTRAVENOUS

## 2016-10-05 MED ORDER — INSULIN ASPART 100 UNIT/ML ~~LOC~~ SOLN
0.0000 [IU] | Freq: Every day | SUBCUTANEOUS | Status: DC
  Administered 2016-10-06: 2 [IU] via SUBCUTANEOUS

## 2016-10-05 NOTE — H&P (Addendum)
History and Physical    Christina Hobbs ZOX:[id number] DOB: 12-10-1930 DOA: 10/05/2016  PCP: Christina Heron, MD  Patient coming from: Home  Chief Complaint: Confusion  HPI: Christina Hobbs is a 83 y.o. female with medical history significant of hypothyroidism, HLD, HTN, murmur, DM2 who presents for confusion and altered mental status.  Per report from the ED, patient was found by her mailperson sitting in the hallway and speaking about her keys.  She cannot remember why she was sitting in the hallway and reported to me that she was sitting on the stairs of her porch.  She remembers the name of the mail person and remembers sitting there, but cannot tell me why.  She cannot tell me what happened during the day today.  She denies any acute symptoms, including dysuria or change in urinary habits.  During the interview, she repeats many things I say verbatim as response to my questions.  She also was fidgeting.  She is alert, distracted, but oriented to person, place, time, president.  She was recently in the ED for acute confusion in February (08/12/16). She was treated for a presumed UTI with keflex.  She reports taking this medication, and reports she was given 15 pills (consistent with Rx 1 tab TID for 5 days).  GCS 14-15  I called her sister Christina Hobbs, who is listed as her Emergency contact for more information.  Christina Hobbs lives in [place] and has been trying to help her sister, but recently Christina Hobbs cut off contact with her sister.  Christina Hobbs tells me that the confusion and problem with ADLs has been going on for at least a year.  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  Christina Hobbs did note that the symptoms were worse with the UTI, and did improve with Abx.  As she lives in [place], she cannot comment on recent changes to medications.  She does report that Christina Hobbs  has a friend who is a taxi driver who has been working for her for a while driving her when needed.  Christina Hobbs spoke to this person and he noted a change in her personality as well and increased confusion.  There is a FH of Christina Hobbs.   ED Course: In the ED, patient was confused.  UA was dirty, but with many squamous cells.  CT head showed some areas concerning for vasogenic edema and MRI was recommended.  EDP started patient on Rocephin and UC was ordered.   Review of Systems: As per HPI otherwise 10 point review of systems negative.  Christina Hobbs denied any symptoms, but given confusion, unclear if this is accurate.    Past Medical History:  Diagnosis Date  . Cardiac murmur   . Dizziness   . Hyperlipidemia   . Hypertension   . Pre-diabetes   . Syncope and collapse   . Thyroid disease   . Vitamin D deficiency     Past Surgical History:  Procedure Laterality Date  . TONSILLECTOMY     Reviewed with patient, but she is confused  reports that she has never smoked. She has never used smokeless tobacco. She reports that she does not drink alcohol or use drugs.  She takes amlodipine at home.  Allergies  Allergen Reactions  . Zestril [Lisinopril] Shortness Of Breath  . Norvasc [Amlodipine Besylate] Itching    Patient tolerates  amlodipine and takes at home.   . Tenormin [Atenolol] Other (See Comments)    fatigue   Reviewed with family member Family History  Problem Relation Age of Onset  . Stroke Mother   . Christina disease Hobbs      Prior to Admission medications   Medication Sig Start Date End Date Taking? Authorizing Provider  ACETAMINOPHEN PO Take 650 mg by mouth every 6 (six) hours as needed.    Historical Provider, MD  amLODipine (NORVASC) 10 MG tablet Take 10 mg by mouth daily with breakfast.    Historical Provider, MD  cephALEXin (KEFLEX) 500 MG capsule Take 1 capsule (500 mg total) by mouth 3 (three) times daily. 08/12/16   Raeford Razor, MD    Cholecalciferol (VITAMIN D) 2000 units tablet Take 2,000 Units by mouth every evening.    Historical Provider, MD  diclofenac sodium (VOLTAREN) 1 % GEL Apply 2 g topically 3 (three) times daily as needed (PAIN). 05/27/16   Abelino Derrick, PA-C  fluticasone (FLONASE) 50 MCG/ACT nasal spray Place 2 sprays into both nostrils as needed for allergies or rhinitis.    Historical Provider, MD  levothyroxine (SYNTHROID, LEVOTHROID) 50 MCG tablet Take 50 mcg by mouth daily before breakfast.    Historical Provider, MD  losartan (COZAAR) 25 MG tablet Take 1 tablet (25 mg total) by mouth daily. Patient not taking: Reported on 08/12/2016 05/27/16 08/25/16  Abelino Derrick, PA-C  metFORMIN (GLUCOPHAGE-XR) 500 MG 24 hr tablet Take 500 mg by mouth every evening.    Historical Provider, MD    Physical Exam: Vitals:   10/05/16 1724 10/05/16 1728 10/05/16 1730 10/05/16 1915  BP: 139/64  134/63 (!) 129/58  Pulse: 77  80 65  Resp:    15  SpO2: 99%  99% 97%  Weight:  123 lb (55.8 kg)    Height:   (1.727 m)      Constitutional: NAD, fidgeting in bed, but outwardly calm Vitals:   10/05/16 1724 10/05/16 1728 10/05/16 1730 10/05/16 1915  BP: 139/64  134/63 (!) 129/58  Pulse: 77  80 65  Resp:    15  SpO2: 99%  99% 97%  Weight:  123 lb (55.8 kg)    Height:   (1.727 m)     Eyes: PERRL, lids and conjunctivae normal, anicteric sclerae ENMT: Mucous membranes are moist. Dentition normal Neck: normal, supple, no masses, no thyromegaly Respiratory: clear to auscultation bilaterally, no wheezing, no crackles. Normal respiratory effort.  Cardiovascular: Regular rate and rhythm, no murmurs / rubs / gallops. No extremity edema.   Abdomen: no tenderness or distention. Bowel sounds positive.  Musculoskeletal: no clubbing / cyanosis.  Normal muscle tone.  Skin: no rashes, lesions, ulcers.  Neurologic: CN 2-12 grossly intact. DTR normal in patellar reflex.  She had some trouble following commands, particularly for  rapid alternating movements.  She was oriented X 3.  She had 4/+ strength in the left upper extremity, 5/5 in all other extremities.  When asked to perform finger to nose with the left hand, she persistently tried to use the right, even with me holding her right hand.  She seemed to neglect the left arm, but gaze orientation and extraocular movements were normal.  Sensation was intact to light touch.   Psychiatric: Insight impaired.  Alert and oriented x 3.   Labs on Admission: I have personally reviewed following labs and imaging studies  CBC:  Recent Labs Lab 10/05/16 1833  WBC 11.3*  NEUTROABS  8.7*  HGB 11.8*  HCT 36.3  MCV 86.6  PLT 281   Basic Metabolic Panel:  Recent Labs Lab 10/05/16 1833  NA 134*  K 3.6  CL 100*  CO2 23  GLUCOSE 137*  BUN 29*  CREATININE 0.87  CALCIUM 9.2   GFR: Estimated Creatinine Clearance: 42.4 mL/min (by C-G formula based on SCr of 0.87 mg/dL). Liver Function Tests:  Recent Labs Lab 10/05/16 1833  AST 23  ALT 11*  ALKPHOS 76  BILITOT 0.8  PROT 6.9  ALBUMIN 3.7   No results for input(s): LIPASE, AMYLASE in the last 168 hours. No results for input(s): AMMONIA in the last 168 hours. Coagulation Profile: No results for input(s): INR, PROTIME in the last 168 hours. Cardiac Enzymes: No results for input(s): CKTOTAL, CKMB, CKMBINDEX, TROPONINI in the last 168 hours. BNP (last 3 results) No results for input(s): PROBNP in the last 8760 hours. HbA1C: No results for input(s): HGBA1C in the last 72 hours. CBG: No results for input(s): GLUCAP in the last 168 hours. Lipid Profile: No results for input(s): CHOL, HDL, LDLCALC, TRIG, CHOLHDL, LDLDIRECT in the last 72 hours. Thyroid Function Tests: No results for input(s): TSH, T4TOTAL, FREET4, T3FREE, THYROIDAB in the last 72 hours. Anemia Panel: No results for input(s): VITAMINB12, FOLATE, FERRITIN, TIBC, IRON, RETICCTPCT in the last 72 hours. Urine analysis:    Component Value Date/Time    COLORURINE YELLOW 10/05/2016 1802   APPEARANCEUR CLOUDY (A) 10/05/2016 1802   LABSPEC 1.013 10/05/2016 1802   PHURINE 5.0 10/05/2016 1802   GLUCOSEU NEGATIVE 10/05/2016 1802   HGBUR MODERATE (A) 10/05/2016 1802   BILIRUBINUR NEGATIVE 10/05/2016 1802   KETONESUR 20 (A) 10/05/2016 1802   PROTEINUR 30 (A) 10/05/2016 1802   NITRITE NEGATIVE 10/05/2016 1802   LEUKOCYTESUR LARGE (A) 10/05/2016 1802    Radiological Exams on Admission: Ct Head Wo Contrast  Result Date: 10/05/2016 CLINICAL DATA:  Possible fall.  Confusion. EXAM: CT HEAD WITHOUT CONTRAST TECHNIQUE: Contiguous axial images were obtained from the base of the skull through the vertex without intravenous contrast. COMPARISON:  None. FINDINGS: Brain: There are moderately large areas of confluent, marked white matter hypoattenuation suggesting vasogenic edema in both frontal lobes and right temporal lobe with milder edema in the right parietal lobe. No definite mass lesions are identified on this unenhanced study. No definite acute large territory vascular infarct, acute intracranial hemorrhage, midline shift, or extra-axial fluid collection is identified. Additional periventricular white matter hypodensities may reflect chronic small vessel ischemia. Ventricles and sulci are normal in size for age. Vascular: Calcified atherosclerosis at the skullbase. No hyperdense vessel. Skull: No fracture or focal osseous lesion. Sinuses/Orbits: Small left maxillary sinus mucous retention cyst. Clear mastoid air cells. Unremarkable orbits. Other: None. IMPRESSION: Regions of extensive vasogenic edema in both cerebral hemispheres. Contrast-enhanced brain MRI is recommended to evaluate for underlying neoplasm or infection. No midline shift or acute intracranial hemorrhage. Electronically Signed   By: Sebastian Ache M.D.   On: 10/05/2016 18:43    EKG: Independently reviewed. Flipped TW in AVL, otherwise NSR  Assessment/Plan Confusion, AMS - With timeline as  reported by sister, DDx certainly includes UTI, but should also include intracranial process (vasogenic edema on CT scan), pseudodementia from depression (lives alone, financially insecure per sister), dementia, metabolic causes (no obvious signs on CMET on admission).  She has no fever, no headache.  I think acute encephalitis less likely given long term nature of the symptoms (over months to the last year).   -  MRI brain, will attempt to get without sedation, but can have low dose valium if needed.  - Considered steroids for vasogenic edema, will hold until MRI at this point due to small chance of infection.  If MRI consistent with intracranial mass, will start dexamethasone  - Fall precautions - Treat UTI - TnI 0.02 in the ED, she denies chest pain - Na is 134, unlikely to be the cause of confusion, seems to be chronic back to 2015 at this very minimally decreased level.   UTI (urinary tract infection) - UA does appear consistent with UTI, and she had a previous infection/confusion which improved with keflex - UC pending, change therapy based on culture data - IVF with NS at 50cc/hr overnight - Lactic acid 1.19, renal function at baseline - WBC 11.3, trend    Hypothyroidism - Check TSH, she does not have any signs of myxedema to explain confusion at this point - Continue synthroid    Non-insulin treated type 2 diabetes mellitus - Hold metformin in acute setting - SSI - Check A1C    Hypertension - BP 120s - 130 systolic, she reports taking amlodipine, not losartan - Continue amlodipine with hold parameters  Vitamin D deficiency  - Check Vitamin D   DVT prophylaxis: Lovenox Code Status: Full, will need to address further when patient able, or with family Family Communication: Christina Hobbs, sister.  (h) [phone], (c) [phone]; requesting daily or every other day updates Disposition Plan: Obs for now, will need MRI and PT evaluation Consults called: None Admission  status: Obs, Telemetry   Debe Coder MD [facility] Pager [phone]  If 7PM-7AM, please contact night-coverage [email or web address] Password [email or web address]  10/05/2016, 8:14 PM

## 2016-10-05 NOTE — ED Notes (Signed)
Any questions? Call sister Janora Norlander @ [phone] (cell-try first) or [phone] (landline)

## 2016-10-05 NOTE — ED Notes (Signed)
Patient transported to X-ray 

## 2016-10-05 NOTE — ED Notes (Signed)
Please call sister if need be... Christina Hobbs [phone] or [phone]

## 2016-10-05 NOTE — ED Provider Notes (Signed)
[facility]-EMERGENCY DEPT Provider Note   CSN: [id number] Arrival date & time: 10/05/16  1723     History   Chief Complaint Chief Complaint  Patient presents with  . Fall    HPI Christina Hobbs is a 83 y.o. female.  HPI  83 year old female with history of HTN, HLD, thyroid disease, he presents after being confused in the hallway outside of her apartment. Much of the history is obtained by EMS, as the patient is confused and forgetful about the events of the last few hours. They stated the patient was found seated in the hallway outside her apartment and found by the mailman. She reportedly told both the mailman and EMS that "I'm waiting for my keys to find me." Patient repeats this phrase to me as well. She is unsure why she was sitting in the hallway. She vehemently denies falling or losing consciousness and states that she just sat down on the floor. She denies any areas of pain. Denies recent illnesses, fevers, chills, chest pain, shortness of breath, abdominal pain, nausea, vomiting, diarrhea, dysuria, or frequency.  Of note, patient was seen at [facility] last month for some confusion, at which point it was thought that she had a UTI. She was discharged home with prescription for an antibiotic for treatment.  Patient denies confusion or memory deficits to me. States that she did not have a history of CVA, TIA, or dementia.   Past Medical History:  Diagnosis Date  . Cardiac murmur   . Dizziness   . Hyperlipidemia   . Hypertension   . Pre-diabetes   . Syncope and collapse   . Thyroid disease   . Vitamin D deficiency     Patient Active Problem List   Diagnosis Date Noted  . UTI (urinary tract infection) 10/05/2016  . Thyroid disease   . Hypertension   . Pre-diabetes   . Non-insulin treated type 2 diabetes mellitus (HCC) 05/27/2016  . Systolic murmur 05/27/2016  . DYSPNEA 05/23/2008  . Hypothyroidism 05/20/2008  . Uncontrolled hypertension 05/20/2008  . ASTHMA 05/20/2008     Past Surgical History:  Procedure Laterality Date  . TONSILLECTOMY      OB History    No data available       Home Medications    Prior to Admission medications   Medication Sig Start Date End Date Taking? Authorizing Provider  amLODipine (NORVASC) 10 MG tablet Take 10 mg by mouth daily.    Yes Historical Provider, MD  diclofenac sodium (VOLTAREN) 1 % GEL Apply 2 g topically 3 (three) times daily as needed (PAIN). Patient taking differently: Apply 2 g topically 3 (three) times daily as needed (pain).  05/27/16  Yes Luke K Kilroy, PA-C  levothyroxine (SYNTHROID, LEVOTHROID) 50 MCG tablet Take 50 mcg by mouth daily before breakfast.   Yes Historical Provider, MD  metFORMIN (GLUCOPHAGE-XR) 500 MG 24 hr tablet Take 500 mg by mouth daily.    Yes Historical Provider, MD    Family History Family History  Problem Relation Age of Onset  . Stroke Mother   . Alzheimer's disease Father     Social History Social History  Substance Use Topics  . Smoking status: Never Smoker  . Smokeless tobacco: Never Used  . Alcohol use No     Allergies   Zestril [lisinopril]; Norvasc [amlodipine besylate]; and Tenormin [atenolol]   Review of Systems Review of Systems  Constitutional: Negative for chills and fever.  HENT: Negative for congestion, ear pain, rhinorrhea and sore throat.  Eyes: Negative for visual disturbance.  Respiratory: Negative for cough and shortness of breath.   Cardiovascular: Negative for chest pain and palpitations.  Gastrointestinal: Negative for abdominal pain, diarrhea, nausea and vomiting.  Genitourinary: Negative for dysuria, flank pain, frequency and pelvic pain.  Musculoskeletal: Positive for neck pain. Negative for arthralgias, back pain, myalgias and neck stiffness.  Skin: Negative for rash.  Neurological: Negative for dizziness, weakness, light-headedness, numbness and headaches.  Psychiatric/Behavioral: Positive for confusion and decreased  concentration. Negative for agitation.     Physical Exam Updated Vital Signs BP (!) 129/57   Pulse 69   Resp 13   Ht  (1.727 m)   Wt 55.8 kg   SpO2 100%   BMI 18.70 kg/m   Physical Exam  Constitutional: She appears well-developed and well-nourished. No distress.  HENT:  Head: Normocephalic and atraumatic.  Right Ear: External ear normal.  Left Ear: External ear normal.  Nose: Nose normal.  Mouth/Throat: Oropharynx is clear and moist. No oropharyngeal exudate.  Eyes: Conjunctivae and EOM are normal. Pupils are equal, round, and reactive to light.  Neck: Normal range of motion. Neck supple.  No midline TTP over the cervical spine  Cardiovascular: Normal rate, regular rhythm, normal heart sounds and intact distal pulses.   No murmur heard. Pulmonary/Chest: Effort normal and breath sounds normal. No respiratory distress. She has no wheezes. She exhibits no tenderness.  Abdominal: Soft. Bowel sounds are normal. She exhibits no distension. There is no tenderness. There is no guarding.  Musculoskeletal: Normal range of motion. She exhibits no edema.  Extremities atraumatic. No midline TTP over the T or L spine.   Neurological: She is alert. She exhibits normal muscle tone.  Oriented to person, place, and time. Unsure about her birth date. Confused about recent events.   Skin: Skin is warm and dry. She is not diaphoretic.  Psychiatric: She has a normal mood and affect.  Nursing note and vitals reviewed.    ED Treatments / Results  Labs (all labs ordered are listed, but only abnormal results are displayed) Labs Reviewed  CBC WITH DIFFERENTIAL/PLATELET - Abnormal; Notable for the following:       Result Value   WBC 11.3 (*)    Hemoglobin 11.8 (*)    Neutro Abs 8.7 (*)    All other components within normal limits  COMPREHENSIVE METABOLIC PANEL - Abnormal; Notable for the following:    Sodium 134 (*)    Chloride 100 (*)    Glucose, Bld 137 (*)    BUN 29 (*)    ALT 11  (*)    GFR calc non Af Amer 59 (*)    All other components within normal limits  URINALYSIS, ROUTINE W REFLEX MICROSCOPIC - Abnormal; Notable for the following:    APPearance CLOUDY (*)    Hgb urine dipstick MODERATE (*)    Ketones, ur 20 (*)    Protein, ur 30 (*)    Leukocytes, UA LARGE (*)    Bacteria, UA MANY (*)    Squamous Epithelial / LPF 6-30 (*)    Non Squamous Epithelial 6-30 (*)    All other components within normal limits  URINE CULTURE  I-STAT CG4 LACTIC ACID, ED  Rosezena Sensor, ED  I-STAT CG4 LACTIC ACID, ED  Rosezena Sensor, ED    EKG  EKG Interpretation  Date/Time:  Saturday October 05 2016 17:39:11 EDT Ventricular Rate:  77 PR Interval:    QRS Duration: 87 QT Interval:  377 QTC Calculation:  427 R Axis:   34 Text Interpretation:  Sinus rhythm Abnormal R-wave progression, early transition No significant change since last tracing Confirmed by KNAPP  MD-J, JON [phone]) on 10/05/2016 6:26:57 PM       Radiology Ct Head Wo Contrast  Result Date: 10/05/2016 CLINICAL DATA:  Possible fall.  Confusion. EXAM: CT HEAD WITHOUT CONTRAST TECHNIQUE: Contiguous axial images were obtained from the base of the skull through the vertex without intravenous contrast. COMPARISON:  None. FINDINGS: Brain: There are moderately large areas of confluent, marked white matter hypoattenuation suggesting vasogenic edema in both frontal lobes and right temporal lobe with milder edema in the right parietal lobe. No definite mass lesions are identified on this unenhanced study. No definite acute large territory vascular infarct, acute intracranial hemorrhage, midline shift, or extra-axial fluid collection is identified. Additional periventricular white matter hypodensities may reflect chronic small vessel ischemia. Ventricles and sulci are normal in size for age. Vascular: Calcified atherosclerosis at the skullbase. No hyperdense vessel. Skull: No fracture or focal osseous lesion. Sinuses/Orbits:  Small left maxillary sinus mucous retention cyst. Clear mastoid air cells. Unremarkable orbits. Other: None. IMPRESSION: Regions of extensive vasogenic edema in both cerebral hemispheres. Contrast-enhanced brain MRI is recommended to evaluate for underlying neoplasm or infection. No midline shift or acute intracranial hemorrhage. Electronically Signed   By: Sebastian Ache M.D.   On: 10/05/2016 18:43    Procedures Procedures (including critical care time)  Medications Ordered in ED Medications  cefTRIAXone (ROCEPHIN) 1 g in dextrose 5 % 50 mL IVPB (0 g Intravenous Stopped 10/05/16 2008)     Initial Impression / Assessment and Plan / ED Course  I have reviewed the triage vital signs and the nursing notes.  Pertinent labs & imaging results that were available during my care of the patient were reviewed by me and considered in my medical decision making (see chart for details).    Afebrile and hemodynamically stable. Patient is confused, unsure why she is here and unsure of recent events. Denies pain and physical exam is benign as above. No evidence of trauma.  Labs remarkable for mild leukocytosis to 11.3. Hemoglobin 11. No lactic acidosis.  UA suggestive of UTI, with large leukocytes, many bacteria, cloudy. Culture sent and patient given a dose of Rocephin for empiric treatment of likely UTI. CT head with no acute intracranial abnormality. Neuro exam nonfocal as above, and I doubt TIA or CVA. EKG with no changes from prior tracing, and troponin is 0.02.  Suspect the patient's confusion may be secondary to UTI. Plan to admit to medicine for further workup and management.  Care of patient overseen by my attending, Dr. Lynelle Doctor.  Final Clinical Impressions(s) / ED Diagnoses   Final diagnoses:  Confusion  Urinary tract infection without hematuria, site unspecified    New Prescriptions New Prescriptions   No medications on file     Charlie Pitter, MD 10/05/16 2030    Linwood Dibbles,  MD 10/06/16 1404

## 2016-10-05 NOTE — ED Triage Notes (Signed)
Per ems pt was found at her apartment complex in the hall way and not sure how long she had been sitting there.. Pt was very confused and was telling EMS" she was waiting on her keys to come and get her."

## 2016-10-05 NOTE — Progress Notes (Signed)
Pt arrived floor, [id number]. Pt was actively pulling on lines. MD was paged if she can have something to help her relax. Will continue to monitor.  At 9:35pm, MD called and pt in an order for Ativan. Ativan administered. Will continue to monitor.

## 2016-10-05 NOTE — Progress Notes (Signed)
Pt arrived floor, 5w25. Pt was brought in the bed by an ED tech. Cardiac telemetry was initiated. Vitals completed. Physical assessment including skin assessment was completed. Pt was AxO x3, confused about the situation. Admission history was incomplete because of pt's mental status. Will continue to moitor.

## 2016-10-06 ENCOUNTER — Observation Stay (HOSPITAL_COMMUNITY): Payer: Medicare Other

## 2016-10-06 DIAGNOSIS — G9340 Encephalopathy, unspecified: Secondary | ICD-10-CM | POA: Diagnosis not present

## 2016-10-06 DIAGNOSIS — B9620 Unspecified Escherichia coli [E. coli] as the cause of diseases classified elsewhere: Secondary | ICD-10-CM | POA: Diagnosis present

## 2016-10-06 DIAGNOSIS — R011 Cardiac murmur, unspecified: Secondary | ICD-10-CM | POA: Diagnosis present

## 2016-10-06 DIAGNOSIS — F0280 Dementia in other diseases classified elsewhere without behavioral disturbance: Secondary | ICD-10-CM | POA: Diagnosis present

## 2016-10-06 DIAGNOSIS — W19XXXA Unspecified fall, initial encounter: Secondary | ICD-10-CM | POA: Diagnosis present

## 2016-10-06 DIAGNOSIS — G94 Other disorders of brain in diseases classified elsewhere: Secondary | ICD-10-CM | POA: Diagnosis not present

## 2016-10-06 DIAGNOSIS — N390 Urinary tract infection, site not specified: Secondary | ICD-10-CM | POA: Diagnosis not present

## 2016-10-06 DIAGNOSIS — E038 Other specified hypothyroidism: Secondary | ICD-10-CM

## 2016-10-06 DIAGNOSIS — E119 Type 2 diabetes mellitus without complications: Secondary | ICD-10-CM | POA: Diagnosis not present

## 2016-10-06 DIAGNOSIS — F0390 Unspecified dementia without behavioral disturbance: Secondary | ICD-10-CM | POA: Diagnosis not present

## 2016-10-06 DIAGNOSIS — R410 Disorientation, unspecified: Secondary | ICD-10-CM

## 2016-10-06 DIAGNOSIS — G936 Cerebral edema: Secondary | ICD-10-CM

## 2016-10-06 DIAGNOSIS — Z888 Allergy status to other drugs, medicaments and biological substances status: Secondary | ICD-10-CM | POA: Diagnosis not present

## 2016-10-06 DIAGNOSIS — E1165 Type 2 diabetes mellitus with hyperglycemia: Secondary | ICD-10-CM | POA: Diagnosis present

## 2016-10-06 DIAGNOSIS — R4182 Altered mental status, unspecified: Secondary | ICD-10-CM | POA: Diagnosis not present

## 2016-10-06 DIAGNOSIS — E039 Hypothyroidism, unspecified: Secondary | ICD-10-CM | POA: Diagnosis present

## 2016-10-06 DIAGNOSIS — R40241 Glasgow coma scale score 13-15, unspecified time: Secondary | ICD-10-CM | POA: Diagnosis present

## 2016-10-06 DIAGNOSIS — E871 Hypo-osmolality and hyponatremia: Secondary | ICD-10-CM | POA: Diagnosis present

## 2016-10-06 DIAGNOSIS — E876 Hypokalemia: Secondary | ICD-10-CM | POA: Diagnosis present

## 2016-10-06 DIAGNOSIS — E559 Vitamin D deficiency, unspecified: Secondary | ICD-10-CM | POA: Diagnosis present

## 2016-10-06 DIAGNOSIS — E785 Hyperlipidemia, unspecified: Secondary | ICD-10-CM | POA: Diagnosis present

## 2016-10-06 DIAGNOSIS — I10 Essential (primary) hypertension: Secondary | ICD-10-CM | POA: Diagnosis not present

## 2016-10-06 DIAGNOSIS — Z79899 Other long term (current) drug therapy: Secondary | ICD-10-CM | POA: Diagnosis not present

## 2016-10-06 DIAGNOSIS — R836 Abnormal cytological findings in cerebrospinal fluid: Secondary | ICD-10-CM | POA: Diagnosis not present

## 2016-10-06 DIAGNOSIS — T380X5A Adverse effect of glucocorticoids and synthetic analogues, initial encounter: Secondary | ICD-10-CM | POA: Diagnosis present

## 2016-10-06 DIAGNOSIS — E854 Organ-limited amyloidosis: Secondary | ICD-10-CM | POA: Diagnosis not present

## 2016-10-06 DIAGNOSIS — I680 Cerebral amyloid angiopathy: Secondary | ICD-10-CM | POA: Diagnosis not present

## 2016-10-06 DIAGNOSIS — S0990XA Unspecified injury of head, initial encounter: Secondary | ICD-10-CM | POA: Diagnosis not present

## 2016-10-06 DIAGNOSIS — Z794 Long term (current) use of insulin: Secondary | ICD-10-CM | POA: Diagnosis not present

## 2016-10-06 LAB — BASIC METABOLIC PANEL
ANION GAP: 10 (ref 5–15)
BUN: 15 mg/dL (ref 6–20)
CHLORIDE: 96 mmol/L — AB (ref 101–111)
CO2: 29 mmol/L (ref 22–32)
Calcium: 9.2 mg/dL (ref 8.9–10.3)
Creatinine, Ser: 0.66 mg/dL (ref 0.44–1.00)
GFR calc Af Amer: >60 mL/min (ref >60)
GFR calc non Af Amer: >60 mL/min (ref >60)
GLUCOSE: 114 mg/dL — AB (ref 65–99)
POTASSIUM: 2.8 mmol/L — AB (ref 3.5–5.1)
Sodium: 135 mmol/L (ref 135–145)

## 2016-10-06 LAB — CBC
HEMATOCRIT: 33.5 % — AB (ref 36.0–46.0)
HEMOGLOBIN: 11 g/dL — AB (ref 12.0–15.0)
MCH: 28.7 pg (ref 26.0–34.0)
MCHC: 32.8 g/dL (ref 30.0–36.0)
MCV: 87.5 fL (ref 78.0–100.0)
Platelets: 258 10*3/uL (ref 150–400)
RBC: 3.83 MIL/uL — ABNORMAL LOW (ref 3.87–5.11)
RDW: 14.7 % (ref 11.5–15.5)
WBC: 7.9 10*3/uL (ref 4.0–10.5)

## 2016-10-06 LAB — GLUCOSE, CAPILLARY
Glucose-Capillary: 116 mg/dL — ABNORMAL HIGH (ref 65–99)
Glucose-Capillary: 246 mg/dL — ABNORMAL HIGH (ref 65–99)
Glucose-Capillary: 151 mg/dL — ABNORMAL HIGH (ref 65–99)
Glucose-Capillary: 233 mg/dL — ABNORMAL HIGH (ref 65–99)

## 2016-10-06 LAB — MAGNESIUM: Magnesium: 1.7 mg/dL (ref 1.7–2.4)

## 2016-10-06 MED ORDER — SODIUM CHLORIDE 0.9 % IV SOLN
500.0000 mg | Freq: Two times a day (BID) | INTRAVENOUS | Status: AC
  Administered 2016-10-06 – 2016-10-11 (×10): 500 mg via INTRAVENOUS
  Filled 2016-10-06 (×12): qty 4

## 2016-10-06 MED ORDER — POTASSIUM CHLORIDE 20 MEQ/15ML (10%) PO SOLN
30.0000 meq | Freq: Once | ORAL | Status: AC
  Administered 2016-10-06: 30 meq via ORAL
  Filled 2016-10-06: qty 30

## 2016-10-06 MED ORDER — POTASSIUM CHLORIDE CRYS ER 20 MEQ PO TBCR
40.0000 meq | EXTENDED_RELEASE_TABLET | Freq: Once | ORAL | Status: AC
  Administered 2016-10-06: 40 meq via ORAL
  Filled 2016-10-06: qty 2

## 2016-10-06 MED ORDER — DEXTROSE 5 % IV SOLN
1.0000 g | INTRAVENOUS | Status: DC
  Administered 2016-10-06: 1 g via INTRAVENOUS
  Filled 2016-10-06 (×2): qty 10

## 2016-10-06 MED ORDER — SODIUM CHLORIDE 0.9 % IV SOLN
30.0000 meq | Freq: Once | INTRAVENOUS | Status: AC
  Administered 2016-10-06: 30 meq via INTRAVENOUS
  Filled 2016-10-06: qty 15

## 2016-10-06 MED ORDER — MAGNESIUM SULFATE IN D5W 1-5 GM/100ML-% IV SOLN
1.0000 g | Freq: Once | INTRAVENOUS | Status: AC
  Administered 2016-10-06: 1 g via INTRAVENOUS
  Filled 2016-10-06: qty 100

## 2016-10-06 MED ORDER — GADOBENATE DIMEGLUMINE 529 MG/ML IV SOLN
11.0000 mL | Freq: Once | INTRAVENOUS | Status: AC | PRN
  Administered 2016-10-06: 11 mL via INTRAVENOUS
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
## 2016-10-06 NOTE — Progress Notes (Signed)
PROGRESS NOTE    Christina Hobbs  IRJ:[id number] DOB: [birth year] DOA: 10/05/2016 PCP: [name], MD   Outpatient Specialists:    Brief Narrative:  Christina Hobbs is a 83 y.o. female with medical history significant of hypothyroidism, HLD, HTN, murmur, DM2 who presents for confusion and altered mental status.  Found to have an UTI and vasogenic edema suggestive of amyloid.     Assessment & Plan:   Active Problems:   Hypothyroidism   Non-insulin treated type 2 diabetes mellitus (HCC)   UTI (urinary tract infection)   Hypertension   Vasogenic brain edema (HCC)   UTI -Culture pending -White blood cell counts back to baseline -Continue antibiotics  Altered mental status with vasogenic edema of the brain MRI suggestive of amyloidosis Neurology consult for possible steroids and anti-inflammatories Patient was able to tell me who she was where she was and what year it was  Hypothyroid TSH normal  Type 2 diabetes Hold metformin Sliding-scale insulin Hemoglobin A1c pending  Hypokalemia  magnesium low end of normal Will give 1 g Replete IV and by mouth Recheck in a.m.   DVT prophylaxis:  Lovenox   Code Status: Full Code   Family Communication: none  Disposition Plan:     Consultants:   neuro     Subjective: Eating breakfast-- put cream cheese in her "grits" (actually oatmeal)  Objective: Vitals:   10/05/16 1915 10/05/16 2000 10/05/16 2111 10/06/16 0629  BP: (!) 129/58 (!) 129/57 136/61 (!) 107/51  Pulse: 65 69 78 65  Resp: Temp:   98.6 F (37 C) 98.4 F (36.9 C)  TempSrc:   Oral Oral  SpO2: 97% 100% 100% 99%  Weight:      Height:        Intake/Output Summary (Last 24 hours) at 10/06/16 1001 Last data filed at 10/06/16 0438  Gross per 24 hour  Intake              375 ml  Output                0 ml  Net              375 ml   Filed Weights   10/05/16 1728  Weight: 55.8 kg (123 lb)    Examination:  General  exam: Appears calm and comfortable  Respiratory system: Clear to auscultation. Respiratory effort normal. Cardiovascular system: S1 & S2 heard, RRR. No JVD, murmurs, rubs, gallops or clicks. No pedal edema. Gastrointestinal system: Abdomen is nondistended, soft and nontender. No organomegaly or masses felt. Normal bowel sounds heard. Central nervous system: Alert and oriented- to person place and time Extremities: Symmetric 5 x 5 power. Skin: No rashes, lesions or ulcers     CBC:  Recent Labs Lab 10/05/16 1833 10/06/16 0256  WBC 11.3* 7.9  NEUTROABS 8.7*  --   HGB 11.8* 11.0*  HCT 36.3 33.5*  MCV 86.6 87.5  PLT 281 258   Basic Metabolic Panel:  Recent Labs Lab 10/05/16 1833 10/06/16 0256 10/06/16 0600  NA 134* 135  --   K 3.6 2.8*  --   CL 100* 96*  --   CO2 23 29  --   GLUCOSE 137* 114*  --   BUN 29* 15  --   CREATININE 0.87 0.66  --   CALCIUM 9.2 9.2  --   MG  --   --  1.7   GFR: Estimated Creatinine Clearance: 46.1 mL/min (by C-G formula  based on SCr of 0.66 mg/dL). Liver Function Tests:  Recent Labs Lab 10/05/16 1833  AST 23  ALT 11*  ALKPHOS 76  BILITOT 0.8  PROT 6.9  ALBUMIN 3.7   No results for input(s): LIPASE, AMYLASE in the last 168 hours. No results for input(s): AMMONIA in the last 168 hours. Coagulation Profile: No results for input(s): INR, PROTIME in the last 168 hours. Cardiac Enzymes: No results for input(s): CKTOTAL, CKMB, CKMBINDEX, TROPONINI in the last 168 hours. BNP (last 3 results) No results for input(s): PROBNP in the last 8760 hours. HbA1C: No results for input(s): HGBA1C in the last 72 hours. CBG:  Recent Labs Lab 10/05/16 2112 10/06/16 0834  GLUCAP 160* 116*   Lipid Profile: No results for input(s): CHOL, HDL, LDLCALC, TRIG, CHOLHDL, LDLDIRECT in the last 72 hours. Thyroid Function Tests:  Recent Labs  10/05/16 2124  TSH 0.493   Anemia Panel: No results for input(s): VITAMINB12, FOLATE, FERRITIN, TIBC,  IRON, RETICCTPCT in the last 72 hours. Urine analysis:    Component Value Date/Time   COLORURINE YELLOW 10/05/2016 1802   APPEARANCEUR CLOUDY (A) 10/05/2016 1802   LABSPEC 1.013 10/05/2016 1802   PHURINE 5.0 10/05/2016 1802   GLUCOSEU NEGATIVE 10/05/2016 1802   HGBUR MODERATE (A) 10/05/2016 1802   BILIRUBINUR NEGATIVE 10/05/2016 1802   KETONESUR 20 (A) 10/05/2016 1802   PROTEINUR 30 (A) 10/05/2016 1802   NITRITE NEGATIVE 10/05/2016 1802   LEUKOCYTESUR LARGE (A) 10/05/2016 1802     )No results found for this or any previous visit (from the past 240 hour(s)).    Anti-infectives    Start     Dose/Rate Route Frequency Ordered Stop   10/06/16 1800  cefTRIAXone (ROCEPHIN) 1 g in dextrose 5 % 50 mL IVPB     1 g 100 mL/hr over 30 Minutes Intravenous Every 24 hours 10/06/16 0012     10/05/16 1915  cefTRIAXone (ROCEPHIN) 1 g in dextrose 5 % 50 mL IVPB     1 g 100 mL/hr over 30 Minutes Intravenous  Once 10/05/16 1912 10/05/16 2008       Radiology Studies: Dg Chest 2 View  Result Date: 10/05/2016 CLINICAL DATA:  Found in hallway at apartment complex, confusion, history hypertension EXAM: CHEST  2 VIEW COMPARISON:  02/29/2016 FINDINGS: Normal heart size, mediastinal contours, and pulmonary vascularity. Atherosclerotic calcification aorta. Lungs hyperinflated but clear. No infiltrate, pleural effusion or pneumothorax. Bones demineralized. IMPRESSION: Mild hyperinflation without infiltrate. Aortic atherosclerosis. Electronically Signed   By: Ulyses Southward M.D.   On: 10/05/2016 20:30   Ct Head Wo Contrast  Result Date: 10/05/2016 CLINICAL DATA:  Possible fall.  Confusion. EXAM: CT HEAD WITHOUT CONTRAST TECHNIQUE: Contiguous axial images were obtained from the base of the skull through the vertex without intravenous contrast. COMPARISON:  None. FINDINGS: Brain: There are moderately large areas of confluent, marked white matter hypoattenuation suggesting vasogenic edema in both frontal lobes and  right temporal lobe with milder edema in the right parietal lobe. No definite mass lesions are identified on this unenhanced study. No definite acute large territory vascular infarct, acute intracranial hemorrhage, midline shift, or extra-axial fluid collection is identified. Additional periventricular white matter hypodensities may reflect chronic small vessel ischemia. Ventricles and sulci are normal in size for age. Vascular: Calcified atherosclerosis at the skullbase. No hyperdense vessel. Skull: No fracture or focal osseous lesion. Sinuses/Orbits: Small left maxillary sinus mucous retention cyst. Clear mastoid air cells. Unremarkable orbits. Other: None. IMPRESSION: Regions of extensive vasogenic edema in  both cerebral hemispheres. Contrast-enhanced brain MRI is recommended to evaluate for underlying neoplasm or infection. No midline shift or acute intracranial hemorrhage. Electronically Signed   By: Sebastian Ache M.D.   On: 10/05/2016 18:43   Mr Laqueta Jean WU Contrast  Addendum Date: 10/06/2016   ADDENDUM REPORT: 10/06/2016 07:58 ADDENDUM: These results were called by telephone at the time of interpretation on 10/06/2016 at 7:58 am to Dr. Benjamine Mola , who verbally acknowledged these results. Electronically Signed   By: Marlan Palau M.D.   On: 10/06/2016 07:58   Result Date: 10/06/2016 CLINICAL DATA:  Vasogenic brain edema.  Confusion EXAM: MRI HEAD WITHOUT AND WITH CONTRAST TECHNIQUE: Multiplanar, multiecho pulse sequences of the brain and surrounding structures were obtained without and with intravenous contrast. CONTRAST:  11mL MULTIHANCE GADOBENATE DIMEGLUMINE 529 MG/ML IV SOLN COMPARISON:  CT head 10/05/2016 FINDINGS: Brain: Prominent vasogenic edema is present in both cerebral hemispheres involving the frontal lobes bilaterally and the right temporoparietal lobe. There is sparing of the cortex. Lab no, no underlying enhancing mass lesion is identified to explain the vasogenic edema. There is mild effacement of  the sulci due to edema, no shift of the midline structures. There is a dominant finding of extensive microhemorrhage throughout the cerebral cortex bilaterally, with relative sparing of the occipital lobes and posterior fossa. Numerous areas of microhemorrhage are present in the cortex and gray-white junction diffusely. Postcontrast imaging reveals mild leptomeningeal enhancement bilaterally. No enhancing mass lesion. No areas of restricted diffusion.  Negative for acute infarct. Vascular: Normal arterial flow voids. Skull and upper cervical spine: Negative Sinuses/Orbits: Negative Other: None IMPRESSION: Extensive vasogenic edema in both cerebral hemispheres. Extensive microhemorrhage throughout the cerebral cortex bilaterally with relative sparing of the occipital lobes. Mild leptomeningeal enhancement postcontrast infusion. No enhancing mass lesion. Given the constellation of findings and history of indolent progressive confusion, cerebral amyloid with inflammation is considered the most likely diagnosis. Less likely considerations include encephalitis. Metastatic disease not considered likely. Electronically Signed: By: Marlan Palau M.D. On: 10/06/2016 07:53        Scheduled Meds: . amLODipine  10 mg Oral Q breakfast  . cefTRIAXone (ROCEPHIN)  IV  1 g Intravenous Q24H  . cholecalciferol  2,000 Units Oral QPM  . enoxaparin (LOVENOX) injection  40 mg Subcutaneous Q24H  . insulin aspart  0-5 Units Subcutaneous QHS  . insulin aspart  0-9 Units Subcutaneous TID WC  . levothyroxine  50 mcg Oral QAC breakfast  . magnesium sulfate 1 - 4 g bolus IVPB  1 g Intravenous Once  . sodium chloride flush  3 mL Intravenous Q12H   Continuous Infusions:   LOS: 0 days    Time spent: 25 min    Daymion Nazaire U Akshaya Toepfer, DO [facility] Pager [phone]  If 7PM-7AM, please contact night-coverage [email or web address] Password [email or web address] 10/06/2016, 10:01 AM

## 2016-10-06 NOTE — Evaluation (Signed)
Occupational Therapy Evaluation Patient Details Name: Christina Hobbs MRN: [id number] DOB: Dec 16, 1930 Today's Date: 10/06/2016    History of Present Illness Pt is an 83 y.o. female who presented with confusion and altered mental status and was found to have a UTI and vasogenic edema suggestive of amyloidosis on MRI. Pt has a PMH significant for hypothyroidism, hyperlipidemia, hypertension, murmur, and non-insulin treated type 2 diabetes mellitus.   Clinical Impression   PTA, pt was living alone and reports independence with ADL and functional mobility. However, per chart, her sister reports increasing difficulty with ADL over the past year. She currently requires min assist for toilet transfers and mod assist for UB dressing tasks with consistent multimodal cueing for initiation and attention to task. Pt  presents with confusion, difficulty following commands, difficulty problem solving, and decreased awareness impacting her ability to participate in ADL. She additionally demonstrates decreased dynamic standing balance and decreased activity tolerance for ADL.  Feel pt would benefit from SNF placement for continued rehabilitation post-acute D/C in order to maximize independence and return to PLOF. OT will continue to follow acutely in order to improve independence with ADL and functional mobility.    Follow Up Recommendations  SNF;Supervision/Assistance - 24 hour    Equipment Recommendations  Other (comment) (TBD at next venue of care)    Recommendations for Other Services       Precautions / Restrictions Precautions Precautions: Fall Restrictions Weight Bearing Restrictions: No      Mobility Bed Mobility Overal bed mobility: Needs Assistance Bed Mobility: Supine to Sit;Sit to Supine     Supine to sit: Supervision Sit to supine: Min assist   General bed mobility comments: Min assist to raise B LE's into bed after ADL session.  Transfers Overall transfer level: Needs  assistance Equipment used: Rolling walker (2 wheeled) Transfers: Sit to/from Stand Sit to Stand: Min assist         General transfer comment: Min lifting assist to raise to full standing position.    Balance Overall balance assessment: Needs assistance Sitting-balance support: No upper extremity supported;Feet supported Sitting balance-Leahy Scale: Good     Standing balance support: Bilateral upper extremity supported;During functional activity;No upper extremity supported Standing balance-Leahy Scale: Poor Standing balance comment: Reliant on UE support or external assistance to maintain standing balance.                           ADL either performed or assessed with clinical judgement   ADL Overall ADL's : Needs assistance/impaired Eating/Feeding: Supervision/ safety;Set up;Cueing for safety;Cueing for sequencing;Sitting   Grooming: Oral care;Minimal assistance;Standing;Cueing for sequencing;Cueing for safety   Upper Body Bathing: Cueing for safety;Cueing for sequencing;Sitting;Minimal assistance   Lower Body Bathing: Minimal assistance;Sit to/from stand;Cueing for safety;Cueing for sequencing   Upper Body Dressing : Cueing for safety;Cueing for sequencing;Sitting;Moderate assistance Upper Body Dressing Details (indicate cue type and reason): Increased assist due to confusion and decreased ability to follow commands Lower Body Dressing: Minimal assistance;Sit to/from stand   Toilet Transfer: Minimal assistance;Ambulation;BSC;RW;Cueing for safety;Cueing for sequencing   Toileting- Clothing Manipulation and Hygiene: Minimal assistance;Cueing for safety;Cueing for sequencing;Sit to/from stand       Functional mobility during ADLs: Minimal assistance;Rolling walker;Cueing for safety;Cueing for sequencing General ADL Comments: Consistent VC's for initiation and sequencing of tasks. Pt fidgeting with items and perseverating on tasks and unable to follow multi-step  commands. When handed self-care items, pt able to complete tasks more easily than when given verbal  directions.     Vision Baseline Vision/History: Wears glasses Vision Assessment?: No apparent visual deficits Additional Comments: Pt unable to report but will continue to assess.     Perception     Praxis Praxis Praxis tested?: Deficits Deficits: Perseveration;Initiation    Pertinent Vitals/Pain Pain Assessment: Faces Faces Pain Scale: Hurts a little bit Pain Location: L side Pain Descriptors / Indicators: Discomfort Pain Intervention(s): Monitored during session     Hand Dominance     Extremity/Trunk Assessment Upper Extremity Assessment Upper Extremity Assessment: Generalized weakness   Lower Extremity Assessment Lower Extremity Assessment: Generalized weakness (Leaning to the R due to knee buckling at times)       Communication Communication Communication: No difficulties   Cognition Arousal/Alertness: Awake/alert Behavior During Therapy: Restless Overall Cognitive Status: Impaired/Different from baseline Area of Impairment: Orientation;Attention;Memory;Following commands;Safety/judgement;Awareness;Problem solving                 Orientation Level: Disoriented to;Situation (oriented to person, place, and time) Current Attention Level: Selective Memory: Decreased short-term memory Following Commands: Follows one step commands inconsistently;Follows one step commands with increased time Safety/Judgement: Decreased awareness of safety;Decreased awareness of deficits Awareness: Intellectual Problem Solving: Slow processing;Decreased initiation;Difficulty sequencing;Requires verbal cues;Requires tactile cues General Comments: Requires step-by-cueing with multimodal cues and increased time. Pt frequently answers questions by repeating what was asked. She demonstrated perseverative behavior during oral care at sink requiring multiple cues to stop this activity.    General Comments       Exercises     Shoulder Instructions      Home Living Family/patient expects to be discharged to:: Skilled nursing facility Living Arrangements: Alone                               Additional Comments: Pt unable to report home living information other than living alone.      Prior Functioning/Environment Level of Independence: Independent        Comments: Pt unable to provide information concerning PLOF due to confusion other than that she lives alone and that she "thinks" she does ADL independently. Per chart, pt's sister reports decline in ADL and leisure participation as well as an increase in confusion over the past year. Her sister reports a significant change in her personality per chart.        OT Problem List: Decreased strength;Decreased activity tolerance;Impaired balance (sitting and/or standing);Decreased safety awareness;Decreased knowledge of use of DME or AE;Decreased knowledge of precautions;Decreased cognition;Decreased coordination;Impaired vision/perception      OT Treatment/Interventions: Self-care/ADL training;Therapeutic exercise;Energy conservation;Therapeutic activities;Patient/family education;Balance training;Cognitive remediation/compensation;Visual/perceptual remediation/compensation;DME and/or AE instruction    OT Goals(Current goals can be found in the care plan Hobbs) Acute Rehab OT Goals Patient Stated Goal: to get cleaned up OT Goal Formulation: With patient Time For Goal Achievement: 10/20/16 Potential to Achieve Goals: Good ADL Goals Pt Will Perform Grooming: with supervision;standing (3 tasks) Pt Will Transfer to Toilet: with supervision;ambulating;bedside commode (BSC over toilet) Pt Will Perform Toileting - Clothing Manipulation and hygiene: with supervision;sit to/from stand Additional ADL Goal #1: Pt will independently demonstrate emertgent awareness during morning ADL routine in a minimally  distracting environment. Additional ADL Goal #2: Pt will follow 3/4 one-step commands during ADL tasks to improve independence and safety with daily self-care tasks.  OT Frequency: Min 2X/week   Barriers to D/C:            Co-evaluation  End of Session Equipment Utilized During Treatment: Gait belt;Rolling walker  Activity Tolerance: Patient tolerated treatment well Patient left: in bed;with call bell/phone within reach;with bed alarm set  OT Visit Diagnosis: Unsteadiness on feet (R26.81);Other abnormalities of gait and mobility (R26.89);Other symptoms and signs involving cognitive function                Time: 6295-2841 OT Time Calculation (min): 35 min Charges:  OT General Charges $OT Visit: 1 Procedure OT Evaluation $OT Eval Moderate Complexity: 1 Procedure OT Treatments $Self Care/Home Management : 8-22 mins G-Codes:     Christina Section, MS OTR/L  Pager: [phone]   Christina Hobbs 10/06/2016, 5:17 PM

## 2016-10-06 NOTE — Evaluation (Signed)
Physical Therapy Evaluation Patient Details Name: Christina Hobbs MRN: [id number] DOB: [birth year] Today's Date: 10/06/2016   History of Present Illness  Pt is an 83 y.o. female who presented with confusion and altered mental status and was found to have a UTI and vasogenic edema suggestive of amyloidosis on MRI. Pt has a PMH significant for hypothyroidism, hyperlipidemia, hypertension, murmur, and non-insulin treated type 2 diabetes mellitus.  Clinical Impression  Patient presents with problems listed below.  Will benefit from acute PT to maximize functional mobility prior to discharge.  Patient with decreased strength, balance, and cognition, impacting mobility, gait, and safety. Recommend SNF at discharge for continued therapy.    Follow Up Recommendations SNF;Supervision/Assistance - 24 hour    Equipment Recommendations  None recommended by PT    Recommendations for Other Services       Precautions / Restrictions Precautions Precautions: Fall Restrictions Weight Bearing Restrictions: No      Mobility  Bed Mobility Overal bed mobility: Needs Assistance Bed Mobility: Supine to Sit;Sit to Supine     Supine to sit: Supervision Sit to supine: Min assist   General bed mobility comments: Required repeated cues to move to EOB.  Required min assist to bring LE's onto bed.  Transfers Overall transfer level: Needs assistance Equipment used: Rolling walker (2 wheeled) Transfers: Sit to/from Stand Sit to Stand: Min assist         General transfer comment: Verbal cues for hand placement.  Assist to steady during transfer to standing.  Ambulation/Gait Ambulation/Gait assistance: Min assist;Mod assist Ambulation Distance (Feet): 16 Feet Assistive device: Rolling walker (2 wheeled) Gait Pattern/deviations: Step-through pattern;Decreased stance time - right;Decreased step length - left;Decreased step length - right;Decreased stride length;Decreased weight shift to  right;Shuffle;Antalgic;Staggering right;Narrow base of support;Trunk flexed Gait velocity: decreased Gait velocity interpretation: Below normal speed for age/gender General Gait Details: Patient with slow, shuffling gait.  Verbal cues for safe use of RW, and to stay close to RW.  Patient with Rt knee buckling x3 during gait, requiring mod assist to maintain balance/prevent fall.  Stairs            Wheelchair Mobility    Modified Rankin (Stroke Patients Only)       Balance Overall balance assessment: Needs assistance Sitting-balance support: No upper extremity supported;Feet supported Sitting balance-Leahy Scale: Good     Standing balance support: Bilateral upper extremity supported;During functional activity Standing balance-Leahy Scale: Poor Standing balance comment: Reliant on UE support or external assistance to maintain standing balance.                             Pertinent Vitals/Pain Pain Assessment: 0-10 Pain Score: 3  Faces Pain Scale: Hurts a little bit Pain Location: Rt knee Pain Descriptors / Indicators: Grimacing Pain Intervention(s): Limited activity within patient's tolerance;Monitored during session;Repositioned    Home Living Family/patient expects to be discharged to:: Skilled nursing facility Living Arrangements: Alone               Additional Comments: Pt unable to report home living information other than living alone.    Prior Function Level of Independence: Independent         Comments: Pt unable to provide information concerning PLOF due to confusion other than that she lives alone and that she "thinks" she does ADL independently. Per chart, pt's sister reports decline in ADL and leisure participation as well as an increase in confusion over the past year. Her  sister reports a significant change in her personality per chart.     Hand Dominance        Extremity/Trunk Assessment   Upper Extremity Assessment Upper  Extremity Assessment: Defer to OT evaluation    Lower Extremity Assessment Lower Extremity Assessment: Generalized weakness;RLE deficits/detail RLE Deficits / Details: Pain in Rt knee limits strength - grossly 4/5.  Buckling during gait. RLE: Unable to fully assess due to pain    Cervical / Trunk Assessment Cervical / Trunk Assessment: Kyphotic  Communication   Communication: No difficulties  Cognition Arousal/Alertness: Awake/alert Behavior During Therapy: Restless;Flat affect Overall Cognitive Status: Impaired/Different from baseline Area of Impairment: Orientation;Attention;Memory;Following commands;Safety/judgement;Awareness;Problem solving                 Orientation Level: Disoriented to;Situation Current Attention Level: Selective Memory: Decreased short-term memory Following Commands: Follows one step commands inconsistently;Follows one step commands with increased time Safety/Judgement: Decreased awareness of safety;Decreased awareness of deficits Awareness: Intellectual Problem Solving: Slow processing;Decreased initiation;Difficulty sequencing;Requires verbal cues;Requires tactile cues General Comments: Requires repeated cueing for tasks.      General Comments      Exercises     Assessment/Plan    PT Assessment Patient needs continued PT services  PT Problem List Decreased strength;Decreased activity tolerance;Decreased balance;Decreased mobility;Decreased cognition;Decreased knowledge of use of DME;Decreased safety awareness;Pain       PT Treatment Interventions DME instruction;Gait training;Functional mobility training;Therapeutic activities;Therapeutic exercise;Cognitive remediation;Patient/family education    PT Goals (Current goals can be found in the Care Plan section)  Acute Rehab PT Goals Patient Stated Goal: None stated PT Goal Formulation: With patient Time For Goal Achievement: 10/20/16 Potential to Achieve Goals: Fair    Frequency Min  3X/week   Barriers to discharge Decreased caregiver support Lives alone.    Co-evaluation               End of Session Equipment Utilized During Treatment: Gait belt Activity Tolerance: Patient limited by fatigue;Patient limited by pain Patient left: in bed;with call bell/phone within reach;with bed alarm set (OT in room) Nurse Communication: Mobility status (Recommend SNF) PT Visit Diagnosis: Unsteadiness on feet (R26.81);Other abnormalities of gait and mobility (R26.89);Muscle weakness (generalized) (M62.81);Pain Pain - Right/Left: Right Pain - part of body: Knee    Time: 4098-1191 PT Time Calculation (min) (ACUTE ONLY): 17 min   Charges:   PT Evaluation $PT Eval Moderate Complexity: 1 Procedure     PT G Codes:        Durenda Hurt. Renaldo Fiddler, [facility] Acute Rehab Services Pager [phone]   Vena Austria 10/06/2016, 7:28 PM

## 2016-10-07 ENCOUNTER — Inpatient Hospital Stay (HOSPITAL_COMMUNITY): Payer: Medicare Other

## 2016-10-07 DIAGNOSIS — F0390 Unspecified dementia without behavioral disturbance: Secondary | ICD-10-CM

## 2016-10-07 DIAGNOSIS — G9340 Encephalopathy, unspecified: Secondary | ICD-10-CM

## 2016-10-07 DIAGNOSIS — E854 Organ-limited amyloidosis: Principal | ICD-10-CM

## 2016-10-07 DIAGNOSIS — G94 Other disorders of brain in diseases classified elsewhere: Secondary | ICD-10-CM

## 2016-10-07 LAB — CSF CELL COUNT WITH DIFFERENTIAL
RBC COUNT CSF: 3 /mm3 — AB
TUBE #: 3
WBC, CSF: 2 /mm3 (ref 0–5)

## 2016-10-07 LAB — BASIC METABOLIC PANEL
Anion gap: 10 (ref 5–15)
BUN: 20 mg/dL (ref 6–20)
CHLORIDE: 100 mmol/L — AB (ref 101–111)
CO2: 25 mmol/L (ref 22–32)
Calcium: 9.2 mg/dL (ref 8.9–10.3)
Creatinine, Ser: 0.62 mg/dL (ref 0.44–1.00)
GFR calc non Af Amer: >60 mL/min (ref >60)
Glucose, Bld: 226 mg/dL — ABNORMAL HIGH (ref 65–99)
POTASSIUM: 4.4 mmol/L (ref 3.5–5.1)
Sodium: 135 mmol/L (ref 135–145)

## 2016-10-07 LAB — CBC
HEMATOCRIT: 36.9 % (ref 36.0–46.0)
HEMOGLOBIN: 11.8 g/dL — AB (ref 12.0–15.0)
MCH: 28.1 pg (ref 26.0–34.0)
MCHC: 32 g/dL (ref 30.0–36.0)
MCV: 87.9 fL (ref 78.0–100.0)
Platelets: 287 10*3/uL (ref 150–400)
RBC: 4.2 MIL/uL (ref 3.87–5.11)
RDW: 14.6 % (ref 11.5–15.5)
WBC: 7.5 10*3/uL (ref 4.0–10.5)

## 2016-10-07 LAB — URINE CULTURE: Culture: >=100000 — AB

## 2016-10-07 LAB — GLUCOSE, CAPILLARY
GLUCOSE-CAPILLARY: 285 mg/dL — AB (ref 65–99)
GLUCOSE-CAPILLARY: 324 mg/dL — AB (ref 65–99)
GLUCOSE-CAPILLARY: 221 mg/dL — AB (ref 65–99)
Glucose-Capillary: 196 mg/dL — ABNORMAL HIGH (ref 65–99)

## 2016-10-07 LAB — GLUCOSE, CSF: Glucose, CSF: 114 mg/dL — ABNORMAL HIGH (ref 40–70)

## 2016-10-07 LAB — VITAMIN D 25 HYDROXY (VIT D DEFICIENCY, FRACTURES): VIT D 25 HYDROXY: 42.4 ng/mL (ref 30.0–100.0)

## 2016-10-07 LAB — POCT I-STAT TROPONIN I: Troponin i, poc: 0.01 ng/mL (ref 0.00–0.08)

## 2016-10-07 LAB — HEMOGLOBIN A1C
Hgb A1c MFr Bld: 6.8 % — ABNORMAL HIGH (ref 4.8–5.6)
MEAN PLASMA GLUCOSE: 148 mg/dL

## 2016-10-07 LAB — PROTEIN, CSF: Total  Protein, CSF: 81 mg/dL — ABNORMAL HIGH (ref 15–45)

## 2016-10-07 MED ORDER — INSULIN ASPART 100 UNIT/ML ~~LOC~~ SOLN
0.0000 [IU] | Freq: Three times a day (TID) | SUBCUTANEOUS | Status: DC
  Administered 2016-10-07: 15 [IU] via SUBCUTANEOUS
  Administered 2016-10-07: 4 [IU] via SUBCUTANEOUS
  Administered 2016-10-07: 11 [IU] via SUBCUTANEOUS
  Administered 2016-10-08: 15 [IU] via SUBCUTANEOUS
  Administered 2016-10-08: 4 [IU] via SUBCUTANEOUS
  Administered 2016-10-08: 7 [IU] via SUBCUTANEOUS
  Administered 2016-10-09: 15 [IU] via SUBCUTANEOUS
  Administered 2016-10-09: 7 [IU] via SUBCUTANEOUS
  Administered 2016-10-09 – 2016-10-10 (×2): 4 [IU] via SUBCUTANEOUS
  Administered 2016-10-10: 3 [IU] via SUBCUTANEOUS
  Administered 2016-10-10: 15 [IU] via SUBCUTANEOUS
  Administered 2016-10-11: 4 [IU] via SUBCUTANEOUS
  Administered 2016-10-11: 15 [IU] via SUBCUTANEOUS

## 2016-10-07 MED ORDER — CEPHALEXIN 500 MG PO CAPS
500.0000 mg | ORAL_CAPSULE | Freq: Two times a day (BID) | ORAL | Status: DC
  Administered 2016-10-07 – 2016-10-11 (×9): 500 mg via ORAL
  Filled 2016-10-07 (×9): qty 1

## 2016-10-07 MED ORDER — INSULIN GLARGINE 100 UNIT/ML ~~LOC~~ SOLN
10.0000 [IU] | Freq: Every day | SUBCUTANEOUS | Status: DC
  Administered 2016-10-07 – 2016-10-08 (×2): 10 [IU] via SUBCUTANEOUS
  Filled 2016-10-07 (×4): qty 0.1

## 2016-10-07 MED ORDER — INSULIN ASPART 100 UNIT/ML ~~LOC~~ SOLN
0.0000 [IU] | Freq: Every day | SUBCUTANEOUS | Status: DC
  Administered 2016-10-07 – 2016-10-09 (×2): 2 [IU] via SUBCUTANEOUS

## 2016-10-07 MED ORDER — LIDOCAINE HCL 1 % IJ SOLN
INTRAMUSCULAR | Status: AC
  Filled 2016-10-07: qty 10

## 2016-10-07 MED ORDER — LIDOCAINE HCL (PF) 1 % IJ SOLN
5.0000 mL | Freq: Once | INTRAMUSCULAR | Status: AC
  Administered 2016-10-07: 3 mL via INTRADERMAL
  Filled 2016-10-07: qty 5

## 2016-10-07 MED ORDER — INSULIN ASPART 100 UNIT/ML ~~LOC~~ SOLN: 0.0000 [IU] | Freq: Three times a day (TID) | SUBCUTANEOUS | Status: DC

## 2016-10-07 NOTE — Consult Note (Addendum)
[facility] CM Primary Care Navigator  10/07/2016  Christina Hobbs 1930/07/19 [id number]   Met with patient at the bedside to identify possible discharge needs. Patient reports having confused episodes that had led to this admission.  Patient was answering most questions appropriately during this visit. She endorses Dr. Jordan Hawks Rankins with [place] [facility] as her primary care provider.   Patient shared using [place] at [facility] to obtain medications without any problem.   Patient states that she manages her medications at home straight out of the containers.   She reports that she has a friend Tammi Klippel) who drives a [employer] that provides transportation to her doctors' appointments.  Patient lives alone and independent with self care prior to admission.  Her sister Remo Lipps) lives in [place].  Discharge plan is skilled nursing facility (SNF) per PT/ OT recommendation for continued rehabilitation post-acute due to decreased strength, balance, and cognition, impacting mobility, gait, and safety.  Patient voiced understanding to call primary care provider's office when she returns back home, for a post discharge follow-up appointment within a week or sooner if needed.Patient letter (with PCP's contact number) was provided as her reminder.  Explained to patient about [facility] CM services available for health management but she states that DM is managed using Metformin and PCP is monitoring it. Patient has recent A1c of 6.8.  [facility] care management contact information provided for future needs that may arise.   Primary care provider's office called Horris Latino) and notified of patient's health issues/ needs and possible need for referral to [facility] CM after skilled nursing facility discharge for help with health management at home as deemed necessary.  For questions, please contact:  Dannielle Huh, BSN, RN- [facility] Primary Care Navigator   Telephone: [phone] [place]

## 2016-10-07 NOTE — Procedures (Signed)
Indication: evaluate for cerebral angiopathy   Risks of the procedure were dicussed with the patients sister including post-LP headache, bleeding, infection, weakness/numbness of legs(radiculopathy), death.  The patient's proxy agreed and written consent was obtained.   The patient was prepped and draped, and using sterile technique a 20 gauge quinke spinal needle was inserted in the L3/4, 4/5 space. Tno CSF was obtained and will need to be done under Fluoroscopy.    Felicie Morn PA-C [facility] [phone]  M-F  (8:30 am- 4 PM)  10/07/2016, 10:26 AM

## 2016-10-07 NOTE — Progress Notes (Signed)
PROGRESS NOTE    Christina Hobbs  ZOX:[id number] DOB: Aug 25, 1930 DOA: 10/05/2016 PCP: [name], MD   Outpatient Specialists:    Brief Narrative:  Christina Hobbs is a 83 y.o. female with medical history significant of hypothyroidism, HLD, HTN, murmur, DM2 who presents for confusion and altered mental status.  Found to have an UTI and vasogenic edema suggestive of amyloid.     Assessment & Plan:   Active Problems:   Hypothyroidism   Non-insulin treated type 2 diabetes mellitus (HCC)   UTI (urinary tract infection)   Hypertension   Vasogenic brain edema (HCC)   UTI -ecoli-- await sensitivities -White blood cell counts back to baseline -Continue antibiotics  Altered mental status with vasogenic edema of the brain MRI suggestive of amyloidosis Neurology consult for possible steroids and LP  Hypothyroid TSH normal  Type 2 diabetes Hold metformin Sliding-scale insulin Hemoglobin A1c pending Now that patient is on steroids, will plan to add low dose lantus and titrate  Hypokalemia  repleted   DVT prophylaxis:  Lovenox   Code Status: Full Code   Family Communication: Called Sister (only family)  Disposition Plan:     Consultants:   neuro     Subjective: No complaints  Objective: Vitals:   10/06/16 1019 10/06/16 1632 10/06/16 2213 10/07/16 0554  BP: (!) 110/53 (!) 126/57 (!) 158/93 129/82  Pulse: 74 70 78 (!) 52  Resp:  Temp:  98.6 F (37 C) 98.3 F (36.8 C) 99.2 F (37.3 C)  TempSrc:   Oral   SpO2:  100% 99% 100%  Weight:      Height:        Intake/Output Summary (Last 24 hours) at 10/07/16 0846 Last data filed at 10/06/16 1846  Gross per 24 hour  Intake              754 ml  Output                0 ml  Net              754 ml   Filed Weights   10/05/16 1728  Weight: 55.8 kg (123 lb)    Examination:  General exam: poor eye contact Respiratory system: Clear to auscultation. Respiratory effort  normal. Cardiovascular system: S1 & S2 heard, RRR. No JVD, murmurs, rubs, gallops or clicks. No pedal edema. Gastrointestinal system: +BS, soft, NT Central nervous system: alert Skin: No rashes, lesions or ulcers     CBC:  Recent Labs Lab 10/05/16 1833 10/06/16 0256 10/07/16 0411  WBC 11.3* 7.9 7.5  NEUTROABS 8.7*  --   --   HGB 11.8* 11.0* 11.8*  HCT 36.3 33.5* 36.9  MCV 86.6 87.5 87.9  PLT 281 258 287   Basic Metabolic Panel:  Recent Labs Lab 10/05/16 1833 10/06/16 0256 10/06/16 0600 10/07/16 0411  NA 134* 135  --  135  K 3.6 2.8*  --  4.4  CL 100* 96*  --  100*  CO2 23 29  --  25  GLUCOSE 137* 114*  --  226*  BUN 29* 15  --  20  CREATININE 0.87 0.66  --  0.62  CALCIUM 9.2 9.2  --  9.2  MG  --   --  1.7  --    GFR: Estimated Creatinine Clearance: 46.1 mL/min (by C-G formula based on SCr of 0.62 mg/dL). Liver Function Tests:  Recent Labs Lab 10/05/16 1833  AST 23  ALT 11*  ALKPHOS 76  BILITOT 0.8  PROT 6.9  ALBUMIN 3.7   No results for input(s): LIPASE, AMYLASE in the last 168 hours. No results for input(s): AMMONIA in the last 168 hours. Coagulation Profile: No results for input(s): INR, PROTIME in the last 168 hours. Cardiac Enzymes: No results for input(s): CKTOTAL, CKMB, CKMBINDEX, TROPONINI in the last 168 hours. BNP (last 3 results) No results for input(s): PROBNP in the last 8760 hours. HbA1C: No results for input(s): HGBA1C in the last 72 hours. CBG:  Recent Labs Lab 10/06/16 0834 10/06/16 1146 10/06/16 1716 10/06/16 2214 10/07/16 0812  GLUCAP 116* 246* 151* 233* 196*   Lipid Profile: No results for input(s): CHOL, HDL, LDLCALC, TRIG, CHOLHDL, LDLDIRECT in the last 72 hours. Thyroid Function Tests:  Recent Labs  10/05/16 2124  TSH 0.493   Anemia Panel: No results for input(s): VITAMINB12, FOLATE, FERRITIN, TIBC, IRON, RETICCTPCT in the last 72 hours. Urine analysis:    Component Value Date/Time   COLORURINE YELLOW  10/05/2016 1802   APPEARANCEUR CLOUDY (A) 10/05/2016 1802   LABSPEC 1.013 10/05/2016 1802   PHURINE 5.0 10/05/2016 1802   GLUCOSEU NEGATIVE 10/05/2016 1802   HGBUR MODERATE (A) 10/05/2016 1802   BILIRUBINUR NEGATIVE 10/05/2016 1802   KETONESUR 20 (A) 10/05/2016 1802   PROTEINUR 30 (A) 10/05/2016 1802   NITRITE NEGATIVE 10/05/2016 1802   LEUKOCYTESUR LARGE (A) 10/05/2016 1802     ) Recent Results (from the past 240 hour(s))  Urine culture     Status: Abnormal (Preliminary result)   Collection Time: 10/05/16  6:02 PM  Result Value Ref Range Status   Specimen Description URINE, CATHETERIZED  Final   Special Requests NONE  Final   Culture (A)  Final    >=100,000 COLONIES/mL ESCHERICHIA COLI SUSCEPTIBILITIES TO FOLLOW    Report Status PENDING  Incomplete      Anti-infectives    Start     Dose/Rate Route Frequency Ordered Stop   10/06/16 1800  cefTRIAXone (ROCEPHIN) 1 g in dextrose 5 % 50 mL IVPB     1 g 100 mL/hr over 30 Minutes Intravenous Every 24 hours 10/06/16 0012     10/05/16 1915  cefTRIAXone (ROCEPHIN) 1 g in dextrose 5 % 50 mL IVPB     1 g 100 mL/hr over 30 Minutes Intravenous  Once 10/05/16 1912 10/05/16 2008       Radiology Studies: Dg Chest 2 View  Result Date: 10/05/2016 CLINICAL DATA:  Found in hallway at apartment complex, confusion, history hypertension EXAM: CHEST  2 VIEW COMPARISON:  02/29/2016 FINDINGS: Normal heart size, mediastinal contours, and pulmonary vascularity. Atherosclerotic calcification aorta. Lungs hyperinflated but clear. No infiltrate, pleural effusion or pneumothorax. Bones demineralized. IMPRESSION: Mild hyperinflation without infiltrate. Aortic atherosclerosis. Electronically Signed   By: Ulyses Southward M.D.   On: 10/05/2016 20:30   Ct Head Wo Contrast  Result Date: 10/05/2016 CLINICAL DATA:  Possible fall.  Confusion. EXAM: CT HEAD WITHOUT CONTRAST TECHNIQUE: Contiguous axial images were obtained from the base of the skull through the  vertex without intravenous contrast. COMPARISON:  None. FINDINGS: Brain: There are moderately large areas of confluent, marked white matter hypoattenuation suggesting vasogenic edema in both frontal lobes and right temporal lobe with milder edema in the right parietal lobe. No definite mass lesions are identified on this unenhanced study. No definite acute large territory vascular infarct, acute intracranial hemorrhage, midline shift, or extra-axial fluid collection is identified. Additional periventricular white matter hypodensities may reflect chronic small vessel ischemia. Ventricles  and sulci are normal in size for age. Vascular: Calcified atherosclerosis at the skullbase. No hyperdense vessel. Skull: No fracture or focal osseous lesion. Sinuses/Orbits: Small left maxillary sinus mucous retention cyst. Clear mastoid air cells. Unremarkable orbits. Other: None. IMPRESSION: Regions of extensive vasogenic edema in both cerebral hemispheres. Contrast-enhanced brain MRI is recommended to evaluate for underlying neoplasm or infection. No midline shift or acute intracranial hemorrhage. Electronically Signed   By: Sebastian Ache M.D.   On: 10/05/2016 18:43   Mr Laqueta Jean WU Contrast  Addendum Date: 10/06/2016   ADDENDUM REPORT: 10/06/2016 07:58 ADDENDUM: These results were called by telephone at the time of interpretation on 10/06/2016 at 7:58 am to Dr. Benjamine Mola , who verbally acknowledged these results. Electronically Signed   By: Marlan Palau M.D.   On: 10/06/2016 07:58   Result Date: 10/06/2016 CLINICAL DATA:  Vasogenic brain edema.  Confusion EXAM: MRI HEAD WITHOUT AND WITH CONTRAST TECHNIQUE: Multiplanar, multiecho pulse sequences of the brain and surrounding structures were obtained without and with intravenous contrast. CONTRAST:  11mL MULTIHANCE GADOBENATE DIMEGLUMINE 529 MG/ML IV SOLN COMPARISON:  CT head 10/05/2016 FINDINGS: Brain: Prominent vasogenic edema is present in both cerebral hemispheres involving the  frontal lobes bilaterally and the right temporoparietal lobe. There is sparing of the cortex. Lab no, no underlying enhancing mass lesion is identified to explain the vasogenic edema. There is mild effacement of the sulci due to edema, no shift of the midline structures. There is a dominant finding of extensive microhemorrhage throughout the cerebral cortex bilaterally, with relative sparing of the occipital lobes and posterior fossa. Numerous areas of microhemorrhage are present in the cortex and gray-white junction diffusely. Postcontrast imaging reveals mild leptomeningeal enhancement bilaterally. No enhancing mass lesion. No areas of restricted diffusion.  Negative for acute infarct. Vascular: Normal arterial flow voids. Skull and upper cervical spine: Negative Sinuses/Orbits: Negative Other: None IMPRESSION: Extensive vasogenic edema in both cerebral hemispheres. Extensive microhemorrhage throughout the cerebral cortex bilaterally with relative sparing of the occipital lobes. Mild leptomeningeal enhancement postcontrast infusion. No enhancing mass lesion. Given the constellation of findings and history of indolent progressive confusion, cerebral amyloid with inflammation is considered the most likely diagnosis. Less likely considerations include encephalitis. Metastatic disease not considered likely. Electronically Signed: By: Marlan Palau M.D. On: 10/06/2016 07:53        Scheduled Meds: . amLODipine  10 mg Oral Q breakfast  . cefTRIAXone (ROCEPHIN)  IV  1 g Intravenous Q24H  . cholecalciferol  2,000 Units Oral QPM  . insulin aspart  0-5 Units Subcutaneous QHS  . insulin aspart  0-9 Units Subcutaneous TID WC  . levothyroxine  50 mcg Oral QAC breakfast  . methylPREDNISolone (SOLU-MEDROL) injection  500 mg Intravenous Q12H  . sodium chloride flush  3 mL Intravenous Q12H   Continuous Infusions:   LOS: 1 day    Time spent: 25 min    JESSICA U VANN, DO [facility] Pager  [phone]  If 7PM-7AM, please contact night-coverage [email or web address] Password [facility] 10/07/2016, 8:46 AM

## 2016-10-07 NOTE — Progress Notes (Signed)
Subjective: No complaints  Exam: Vitals:   10/06/16 2213 10/07/16 0554  BP: (!) 158/93 129/82  Pulse: 78 (!) 52  Resp: 18 18  Temp: 98.3 F (36.8 C) 99.2 F (37.3 C)      Neuro: -awaike and oriented --able to follow all commands.  CN: Pupils are equal and round. They are symmetrically reactive from 3-->2 mm. EOMI without nystagmus. Facial sensation is intact to light touch. Face is symmetric at rest with normal strength and mobility. Hearing is intact to conversational voice. Palate elevates symmetrically and uvula is midline. Voice is normal in tone, pitch and quality. Bilateral SCM and trapezii are 5/5. Tongue is midline with normal bulk and mobility.  Motor: Normal bulk, tone, and strength. 5/5 throughout. No drift.  Sensation: Intact to light touch.  DTRs: 2+, symmetric  Toes downgoing bilaterally. No pathologic reflexes.  Coordination: Finger-to-nose and heel-to-shin are without dysmetria     Pertinent Labs/Diagnostics: none  Felicie Morn PA-C [facility] [phone]  Impression: 83 year old female with progressive encephalopathy associated with vasogenic edema and diffuse microhemorrhages. 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 LP attempted but no CSF obtained and will need to be done under flouroscopy   Recommendations: 1) solymedrol 500 mg twice a day 2) LP under flouroscopy 3) neurology will continue to follow     10/07/2016, 10:27 AM

## 2016-10-08 DIAGNOSIS — I680 Cerebral amyloid angiopathy: Secondary | ICD-10-CM

## 2016-10-08 LAB — GLUCOSE, CAPILLARY
GLUCOSE-CAPILLARY: 310 mg/dL — AB (ref 65–99)
GLUCOSE-CAPILLARY: 215 mg/dL — AB (ref 65–99)
Glucose-Capillary: 190 mg/dL — ABNORMAL HIGH (ref 65–99)
Glucose-Capillary: 182 mg/dL — ABNORMAL HIGH (ref 65–99)

## 2016-10-08 LAB — VDRL, CSF: SYPHILIS VDRL QUANT CSF: NONREACTIVE

## 2016-10-08 MED ORDER — METFORMIN HCL ER 500 MG PO TB24
500.0000 mg | ORAL_TABLET | Freq: Every day | ORAL | Status: DC
  Administered 2016-10-08 – 2016-10-11 (×4): 500 mg via ORAL
  Filled 2016-10-08 (×4): qty 1

## 2016-10-08 NOTE — Clinical Social Work Note (Signed)
Clinical Social Work Assessment  Patient Details  Name: Christina Hobbs MRN: [id number] Date of Birth: January 26, 1931  Date of referral:  10/08/16               Reason for consult:  Facility Placement                Permission sought to share information with:  Family Supports Permission granted to share information::     Name::     Futures trader::     Relationship::  sister  Contact Information:     Housing/Transportation Living arrangements for the past 2 months:  Single Family Home Source of Information:   (Sibling) Patient Interpreter Needed:  None Criminal Activity/Legal Involvement Pertinent to Current Situation/Hospitalization:  No - Comment as needed Significant Relationships:  Siblings Lives with:  Self Do you feel safe going back to the place where you live?    Need for family participation in patient care:     Care giving concerns:  Pt is only alert to self and time. CSW spoke with pt's sister via telephone.   Social Worker assessment / plan:  CSW spoke with pt's sister via telephone. Pt lives home alone. Pt has been independent previous to admission. Pt's sister reports pt has not spouse nor children only her sister. Pt's sister is agreeable to pt going to SNF at d/c. Pt's sister is agreeable to general [facility] f/o. CSW will present pt's sister with b/o once determined.  Employment status:  Retired Health and safety inspector:  Medicare PT Recommendations:  Skilled Nursing Facility Information / Referral to community resources:  Skilled Nursing Facility  Patient/Family's Response to care:  Pt's sister verbalized understanding of CSW role and expressed appreciation for support. Pt's sister denies any concern regarding pt care at this time.   Patient/Family's Understanding of and Emotional Response to Diagnosis, Current Treatment, and Prognosis:  Pt's sister understanding and realistic regarding physical limitations. Pt's sister understands the need for SNF placement at  d/c. Pt's sister agreeable to SNF placement at d/c, at this time.  Pt's sister denies any concern regarding treatment plan at this time. CSW will continue to provide support and facilitate d/c needs.   Emotional Assessment Appearance:  Appears stated age Attitude/Demeanor/Rapport:  Unable to Assess Affect (typically observed):  Unable to Assess Orientation:  Oriented to Self, Oriented to  Time Alcohol / Substance use:  Not Applicable Psych involvement (Current and /or in the community):  No (Comment)  Discharge Needs  Concerns to be addressed:  No discharge needs identified Readmission within the last 30 days:  No Current discharge risk:  Dependent with Mobility Barriers to Discharge:  Continued Medical Work up   [employer], LCSW 10/08/2016, 2:53 PM

## 2016-10-08 NOTE — Progress Notes (Signed)
qPhysical Therapy Treatment Patient Details Name: Christina Hobbs MRN: [id number] DOB: 04-17-1931 Today's Date: 10/08/2016    History of Present Illness Pt is an 83 y.o. female who presented with confusion and altered mental status and was found to have a UTI and vasogenic edema suggestive of amyloidosis on MRI. Pt has a PMH significant for hypothyroidism, hyperlipidemia, hypertension, murmur, and non-insulin treated type 2 diabetes mellitus.    PT Comments    Pt disoriented to place/time/situation this AM, holding phone to her ear and observing that a voice was telling her to ask the operator for assistance, but she doesn't know what she needs assistance for.  Pt moderately difficult to redirect to therapy session.  Tangential throughout session, requiring frequent cues to attend to task.  Currently min assist for all mobility 2/2 decreased attention and cognitive deficits.    Follow Up Recommendations  SNF;Supervision/Assistance - 24 hour     Equipment Recommendations       Recommendations for Other Services       Precautions / Restrictions Precautions Precautions: Fall Precaution Comments: decreased orientation and awareness Restrictions Weight Bearing Restrictions: No    Mobility  Bed Mobility Overal bed mobility: Needs Assistance Bed Mobility: Supine to Sit;Sit to Supine     Supine to sit: Supervision Sit to supine: Supervision      Transfers Overall transfer level: Needs assistance Equipment used: 1 person hand held assist Transfers: Sit to/from Stand Sit to Stand: Min assist         General transfer comment: Requires min tactile cues for forward weight shift to stand  Ambulation/Gait Ambulation/Gait assistance: Min assist Ambulation Distance (Feet): 150 Feet Assistive device: 1 person hand held assist Gait Pattern/deviations: Step-to pattern;Scissoring;Staggering right Gait velocity: decreased   General Gait Details: occasional knee buckling with gait,  requires no more than min assist to maintain balance   Stairs            Wheelchair Mobility    Modified Rankin (Stroke Patients Only)       Balance                                            Cognition Arousal/Alertness: Awake/alert Behavior During Therapy: Restless Overall Cognitive Status: Impaired/Different from baseline Area of Impairment: Orientation;Attention;Memory;Following commands;Safety/judgement;Awareness                 Orientation Level: Disoriented to;Place;Time;Situation Current Attention Level: Focused Memory: Decreased short-term memory Following Commands: Follows one step commands inconsistently;Follows one step commands with increased time Safety/Judgement: Decreased awareness of safety;Decreased awareness of deficits Awareness: Intellectual Problem Solving: Slow processing;Difficulty sequencing;Requires verbal cues;Requires tactile cues General Comments: Requires repeated one-step instructions due to decreased awareness and decreased attention      Exercises      General Comments        Pertinent Vitals/Pain Pain Assessment: No/denies pain    Home Living                      Prior Function            PT Goals (current goals can now be found in the care plan section) Acute Rehab PT Goals Patient Stated Goal: None stated Progress towards PT goals: Progressing toward goals    Frequency           PT Plan Current plan remains appropriate  Co-evaluation             End of Session   Activity Tolerance: Patient tolerated treatment well Patient left: in bed;with call bell/phone within reach;with bed alarm set;Other (comment) Carlena Sax) Nurse Communication: Mobility status       Time: 5638-7564 PT Time Calculation (min) (ACUTE ONLY): 16 min  Charges:  $Gait Training: 8-22 mins                    G Codes:       Teodoro Kil, PT, DPT 10/08/16 12:31 PM

## 2016-10-08 NOTE — Clinical Social Work Placement (Signed)
   CLINICAL SOCIAL WORK PLACEMENT  NOTE  Date:  10/08/2016  Patient Details  Name: Christina Hobbs MRN: [id number] Date of Birth: 02/11/1931  Clinical Social Work is seeking post-discharge placement for this patient at the Skilled  Nursing Facility level of care (*CSW will initial, date and re-position this form in  chart as items are completed):      Patient/family provided with [facility] Clinical Social Work Department's list of facilities offering this level of care within the geographic area requested by the patient (or if unable, by the patient's family).  Yes   Patient/family informed of their freedom to choose among providers that offer the needed level of care, that participate in Medicare, Medicaid or managed care program needed by the patient, have an available bed and are willing to accept the patient.      Patient/family informed of 's ownership interest in [facility] and [facility], as well as of the fact that they are under no obligation to receive care at these facilities.  PASRR submitted to EDS on       PASRR number received on 10/08/16     Existing PASRR number confirmed on       FL2 transmitted to all facilities in geographic area requested by pt/family on 10/08/16     FL2 transmitted to all facilities within larger geographic area on       Patient informed that his/her managed care company has contracts with or will negotiate with certain facilities, including the following:        Yes   Patient/family informed of bed offers received.  Patient chooses bed at       Physician recommends and patient chooses bed at      Patient to be transferred to   on 10/09/16.  Patient to be transferred to facility by [facility]     Patient family notified on   of transfer.  Name of family member notified:        PHYSICIAN Please prepare priority discharge summary, including medications, Please prepare prescriptions, Please sign FL2     Additional  Comment:    _______________________________________________ Maree Krabbe, LCSW 10/08/2016, 3:01 PM

## 2016-10-08 NOTE — Progress Notes (Signed)
Inpatient Diabetes Program Recommendations  AACE/ADA: New Consensus Statement on Inpatient Glycemic Control (2015)  Target Ranges:  Prepandial:   less than 140 mg/dL      Peak postprandial:   less than 180 mg/dL (1-2 hours)      Critically ill patients:  140 - 180 mg/dL   Lab Results  Component Value Date   GLUCAP 310 (H) 10/08/2016   HGBA1C 6.8 (H) 10/05/2016    Review of Glycemic Control Results for Christina Hobbs, Christina Hobbs (MRN [id number]) as of 10/08/2016 15:53  Ref. Range 10/07/2016 12:40 10/07/2016 17:42 10/07/2016 21:27 10/08/2016 08:24 10/08/2016 11:59  Glucose-Capillary Latest Ref Range: 65 - 99 mg/dL 147 (H) 829 (H) 562 (H) 190 (H) 310 (H)   Inpatient Diabetes Program Recommendations:  Please consider Novolog 4 units meal coverage tid if eats > 50% while on steroids.  Thank you, Billy Fischer. Klyn Kroening, RN, MSN, CDE Inpatient Glycemic Control Team Team Pager [phone] (8am-5pm) 10/08/2016 3:55 PM

## 2016-10-08 NOTE — Progress Notes (Signed)
PROGRESS NOTE    Christina Hobbs  XBJ:[id number] DOB: 1930-08-31 DOA: 10/05/2016 PCP: [name], MD   Outpatient Specialists:    Brief Narrative:  Christina Hobbs is a 83 y.o. female with medical history significant of hypothyroidism, HLD, HTN, murmur, DM2 who presents for confusion and altered mental status.  Found to have an UTI and vasogenic edema suggestive of amyloid.     Assessment & Plan:   Active Problems:   Hypothyroidism   Non-insulin treated type 2 diabetes mellitus (HCC)   UTI (urinary tract infection)   Hypertension   Vasogenic brain edema (HCC)   Senile brain amyloidosis (HCC)   UTI -ecoli-- pan sensitive -White blood cell counts back to baseline -Change to PO abx  Altered mental status with vasogenic edema of the brain MRI suggestive of amyloidosis s/p LP -steroids started- length of treatment per neurology -patient appears to have an underlying dementia as well-- does not have capacity to make her own decisions  Hypothyroid TSH normal  Type 2 diabetes Resume metformin Sliding-scale insulin Hemoglobin A1c: 6.8 Now that patient is on steroids, will plan to add low dose lantus and titrate  Hypokalemia  repleted   DVT prophylaxis:  Lovenox   Code Status: Full Code   Family Communication: Called Sister (only family)- 4/2  Disposition Plan:     Consultants:   neuro     Subjective: No complaints  Objective: Vitals:   10/07/16 1412 10/07/16 2101 10/08/16 0432 10/08/16 0642  BP: (!) 122/58 (!) 132/57 129/70   Pulse: 70 65 (!) 25 (!) 58  Resp: Temp: 97.7 F (36.5 C) 98.4 F (36.9 C) 97.7 F (36.5 C)   TempSrc: Oral Oral Oral   SpO2: 97% 98% 96%   Weight:      Height:       No intake or output data in the 24 hours ending 10/08/16 0841 Filed Weights   10/05/16 1728  Weight: 55.8 kg (123 lb)    Examination:  General exam: confused about where she is today Respiratory system: Clear to auscultation.  Respiratory effort normal. Cardiovascular system: S1 & S2 heard, RRR. No JVD, murmurs, rubs, gallops or clicks. No pedal edema. Gastrointestinal system: +BS, soft, NT Central nervous system:  Not following commands Skin: No rashes, lesions or ulcers     CBC:  Recent Labs Lab 10/05/16 1833 10/06/16 0256 10/07/16 0411  WBC 11.3* 7.9 7.5  NEUTROABS 8.7*  --   --   HGB 11.8* 11.0* 11.8*  HCT 36.3 33.5* 36.9  MCV 86.6 87.5 87.9  PLT 281 258 287   Basic Metabolic Panel:  Recent Labs Lab 10/05/16 1833 10/06/16 0256 10/06/16 0600 10/07/16 0411  NA 134* 135  --  135  K 3.6 2.8*  --  4.4  CL 100* 96*  --  100*  CO2 23 29  --  25  GLUCOSE 137* 114*  --  226*  BUN 29* 15  --  20  CREATININE 0.87 0.66  --  0.62  CALCIUM 9.2 9.2  --  9.2  MG  --   --  1.7  --    GFR: Estimated Creatinine Clearance: 46.1 mL/min (by C-G formula based on SCr of 0.62 mg/dL). Liver Function Tests:  Recent Labs Lab 10/05/16 1833  AST 23  ALT 11*  ALKPHOS 76  BILITOT 0.8  PROT 6.9  ALBUMIN 3.7   No results for input(s): LIPASE, AMYLASE in the last 168 hours. No results for input(s):  AMMONIA in the last 168 hours. Coagulation Profile: No results for input(s): INR, PROTIME in the last 168 hours. Cardiac Enzymes: No results for input(s): CKTOTAL, CKMB, CKMBINDEX, TROPONINI in the last 168 hours. BNP (last 3 results) No results for input(s): PROBNP in the last 8760 hours. HbA1C:  Recent Labs  10/05/16 2124  HGBA1C 6.8*   CBG:  Recent Labs Lab 10/07/16 0812 10/07/16 1240 10/07/16 1742 10/07/16 2127 10/08/16 0824  GLUCAP 196* 285* 324* 221* 190*   Lipid Profile: No results for input(s): CHOL, HDL, LDLCALC, TRIG, CHOLHDL, LDLDIRECT in the last 72 hours. Thyroid Function Tests:  Recent Labs  10/05/16 2124  TSH 0.493   Anemia Panel: No results for input(s): VITAMINB12, FOLATE, FERRITIN, TIBC, IRON, RETICCTPCT in the last 72 hours. Urine analysis:    Component Value  Date/Time   COLORURINE YELLOW 10/05/2016 1802   APPEARANCEUR CLOUDY (A) 10/05/2016 1802   LABSPEC 1.013 10/05/2016 1802   PHURINE 5.0 10/05/2016 1802   GLUCOSEU NEGATIVE 10/05/2016 1802   HGBUR MODERATE (A) 10/05/2016 1802   BILIRUBINUR NEGATIVE 10/05/2016 1802   KETONESUR 20 (A) 10/05/2016 1802   PROTEINUR 30 (A) 10/05/2016 1802   NITRITE NEGATIVE 10/05/2016 1802   LEUKOCYTESUR LARGE (A) 10/05/2016 1802     ) Recent Results (from the past 240 hour(s))  Urine culture     Status: Abnormal   Collection Time: 10/05/16  6:02 PM  Result Value Ref Range Status   Specimen Description URINE, CATHETERIZED  Final   Special Requests NONE  Final   Culture >=100,000 COLONIES/mL ESCHERICHIA COLI (A)  Final   Report Status 10/07/2016 FINAL  Final   Organism ID, Bacteria ESCHERICHIA COLI (A)  Final      Susceptibility   Escherichia coli - MIC*    AMPICILLIN 4 SENSITIVE Sensitive     CEFAZOLIN <=4 SENSITIVE Sensitive     CEFTRIAXONE <=1 SENSITIVE Sensitive     CIPROFLOXACIN <=0.25 SENSITIVE Sensitive     GENTAMICIN <=1 SENSITIVE Sensitive     IMIPENEM <=0.25 SENSITIVE Sensitive     NITROFURANTOIN <=16 SENSITIVE Sensitive     TRIMETH/SULFA <=20 SENSITIVE Sensitive     AMPICILLIN/SULBACTAM <=2 SENSITIVE Sensitive     PIP/TAZO <=4 SENSITIVE Sensitive     Extended ESBL NEGATIVE Sensitive     * >=100,000 COLONIES/mL ESCHERICHIA COLI      Anti-infectives    Start     Dose/Rate Route Frequency Ordered Stop   10/07/16 1230  cephALEXin (KEFLEX) capsule 500 mg     500 mg Oral Every 12 hours 10/07/16 1223     10/06/16 1800  cefTRIAXone (ROCEPHIN) 1 g in dextrose 5 % 50 mL IVPB  Status:  Discontinued     1 g 100 mL/hr over 30 Minutes Intravenous Every 24 hours 10/06/16 0012 10/07/16 1223   10/05/16 1915  cefTRIAXone (ROCEPHIN) 1 g in dextrose 5 % 50 mL IVPB     1 g 100 mL/hr over 30 Minutes Intravenous  Once 10/05/16 1912 10/05/16 2008       Radiology Studies: Dg Fluoro Guide Lumbar  Puncture  Result Date: 10/07/2016 CLINICAL DATA:  Cognitive decline.  Cerebral amyloid angiopathy. EXAM: DIAGNOSTIC LUMBAR PUNCTURE UNDER FLUOROSCOPIC GUIDANCE FLUOROSCOPY TIME:  Fluoroscopy Time:  0 minutes 24 seconds PROCEDURE: Informed consent was obtained from the patient prior to the procedure, including potential complications of headache, allergy, and pain. With the patient prone, the lower back was prepped with Betadine. 1% Lidocaine was used for local anesthesia. Lumbar puncture was performed  at the L4-5 level using a 20 gauge needle with return of clear colorless CSF. 19.5 Ml of CSF were obtained for laboratory studies. The patient tolerated the procedure well and there were no apparent complications. IMPRESSION: Lumbar puncture performed at the L4-5 level without complication. Electronically Signed   By: Francene Boyers M.D.   On: 10/07/2016 13:53        Scheduled Meds: . amLODipine  10 mg Oral Q breakfast  . cephALEXin  500 mg Oral Q12H  . cholecalciferol  2,000 Units Oral QPM  . insulin aspart  0-20 Units Subcutaneous TID WC  . insulin aspart  0-5 Units Subcutaneous QHS  . insulin glargine  10 Units Subcutaneous QHS  . levothyroxine  50 mcg Oral QAC breakfast  . methylPREDNISolone (SOLU-MEDROL) injection  500 mg Intravenous Q12H  . sodium chloride flush  3 mL Intravenous Q12H   Continuous Infusions:   LOS: 2 days    Time spent: 25 min    Tera Pellicane U Raghad Lorenz, DO [facility] Pager [phone]  If 7PM-7AM, please contact night-coverage [email or web address] Password [facility] 10/08/2016, 8:41 AM

## 2016-10-08 NOTE — Progress Notes (Signed)
Subjective: Currently patient is in her room holding the phone in her hand. Patient states that she feels that she is opening a [employer]. When asked why she states that people are coming in and out of the kitchen i.e. the bathroom and bringing her food. She states that the people are still in the room. She is holding the phone to her head and stating that she is talking to a [place]. When asked where she is she states that she is "[facility] not [facility]" and that she is at home.  Exam: Vitals:   10/08/16 0642 10/08/16 0843  BP:  (!) 138/57  Pulse: (!) 58   Resp:    Temp:      HEENT-  Normocephalic, no lesions, without obvious abnormality.  Normal external eye and conjunctiva.  Normal TM's bilaterally.  Normal auditory canals and external ears. Normal external nose, mucus membranes and septum.  Normal pharynx.  Mental state: As above and subjective  Neuro:  Cranial nerves: She has some breakup of smooth pursuits in all directions of gaze. 2 through 12 are otherwise normal. Motor: She has variable effort with examination but strength appears to be largely normal with exception of possible mild proximal weakness in both arms. Normal bulk for age. Tone appears to be normal with some mitgehen paratonia. No abnormal movements. No drift. Sensation: She reports intact sensation to light touch. Coordination: Finger to nose is without dysmetria. Finger taps are slow but symmetric. DTRs: 2+, symmetric with absent ankle jerks bilaterally. Toes are downgoing. Multiple frontal release signs are present including glabellar, snout, bilateral grasps, and bilateral palmomental.    Pertinent Labs/Diagnostics: BMP notable for chloride 100, glucose 226 Magnesium 1.7 CBC notable for hemoglobin 11.8 TSH 0.493 Urinalysis with moderate hemoglobin, ketones 20, protein 30, large leukocyte esterase, negative nitrites, white blood cells too numerous to count Urine culture from 10/05/16 growing  Escherichia coli    Impression: 1. 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 Acute encephalopathy 3. Probable dementia 4. Urinary tract infection    Recommendations: Findings are highly suggestive of inflammatory cerebral amyloid angiopathy with associated cognitive impairment or dementia. LP was attempted at bedside yesterdaybut was unsuccessful. This is been completed under fluoroscopy and CSF results thus far unrevealing. Awaiting cytology.  At this time, continue steroids as ICAA has been reported to be responsive to a short course of high-dose steroid therapy. I am presently uncertain about her cognitive baseline and this will need to be better characterized. Her acute encephalopathy may be in part related to urinary tract infection and would continue antibiotic therapy for this. Avoid benzodiazepines, opiates, and anything with strong anticholinergic properties as these are likely to worsen cognition. Treatment is otherwise supportive.  Will continue to await cytology and be alert for steroid psychosis.   Felicie Morn PA-C [facility] [phone]  10/08/2016, 9:41 AM

## 2016-10-08 NOTE — [place] FL2 (Signed)
[place] MEDICAID FL2 LEVEL OF CARE SCREENING TOOL     IDENTIFICATION  Patient Name: Christina Hobbs Birthdate: 01-15-1931 Sex: female Admission Date (Current Location): 10/05/2016  [facility] and [place] Number:  Producer, television/film/video and Address:  [facility] [place], [address] [place], [place] [address]      Provider Number: [id number]  Attending Physician Name and Address:  Joseph Art, DO  Relative Name and Phone Number:       Current Level of Care: Hospital Recommended Level of Care: Skilled Nursing Facility Prior Approval Number:    Date Approved/Denied:   PASRR Number: [id number]  Discharge Plan: SNF    Current Diagnoses: Patient Active Problem List   Diagnosis Date Noted  . Senile brain amyloidosis (HCC) 10/07/2016  . Vasogenic brain edema (HCC) 10/06/2016  . UTI (urinary tract infection) 10/05/2016  . Thyroid disease   . Hypertension   . Pre-diabetes   . Confusion   . Non-insulin treated type 2 diabetes mellitus (HCC) 05/27/2016  . Systolic murmur 05/27/2016  . DYSPNEA 05/23/2008  . Hypothyroidism 05/20/2008  . Uncontrolled hypertension 05/20/2008  . ASTHMA 05/20/2008    Orientation RESPIRATION BLADDER Height & Weight     Self, Time  Normal Incontinent Weight: 123 lb (55.8 kg) Height:   (172.7 cm)  BEHAVIORAL SYMPTOMS/MOOD NEUROLOGICAL BOWEL NUTRITION STATUS      Continent  (Please see d/c summary)  AMBULATORY STATUS COMMUNICATION OF NEEDS Skin   Limited Assist Verbally Normal                       Personal Care Assistance Level of Assistance  Bathing, Feeding, Dressing Bathing Assistance: Limited assistance Feeding assistance: Independent Dressing Assistance: Limited assistance     Functional Limitations Info  Sight, Hearing, Speech Sight Info: Adequate Hearing Info: Adequate Speech Info: Adequate    SPECIAL CARE FACTORS FREQUENCY  PT (By licensed PT), OT (By licensed OT)     PT Frequency: 5x OT  Frequency: 5x            Contractures Contractures Info: Not present    Additional Factors Info  Allergies, Code Status Code Status Info: Full Code Allergies Info: Zestril Lisinopril, Norvasc Amlodipine Besylate, Tenormin Atenolol           Current Medications (10/08/2016):  This is the current hospital active medication list Current Facility-Administered Medications  Medication Dose Route Frequency Provider Last Rate Last Dose  . acetaminophen (TYLENOL) tablet 650 mg  650 mg Oral Q6H PRN Inez Catalina, MD       Or  . acetaminophen (TYLENOL) suppository 650 mg  650 mg Rectal Q6H PRN Inez Catalina, MD      . amLODipine (NORVASC) tablet 10 mg  10 mg Oral Q breakfast Inez Catalina, MD   10 mg at 10/08/16 0843  . cephALEXin (KEFLEX) capsule 500 mg  500 mg Oral Q12H Jessica U Vann, DO   500 mg at 10/08/16 0841  . cholecalciferol (VITAMIN D) tablet 2,000 Units  2,000 Units Oral QPM Inez Catalina, MD   2,000 Units at 10/07/16 1813  . fluticasone (FLONASE) 50 MCG/ACT nasal spray 2 spray  2 spray Each Nare PRN Inez Catalina, MD      . insulin aspart (novoLOG) injection 0-20 Units  0-20 Units Subcutaneous TID WC Joseph Art, DO   15 Units at 10/08/16 1221  . insulin aspart (novoLOG) injection 0-5 Units  0-5 Units Subcutaneous  QHS Joseph Art, DO   2 Units at 10/07/16 2159  . insulin glargine (LANTUS) injection 10 Units  10 Units Subcutaneous QHS Joseph Art, DO   10 Units at 10/07/16 2158  . levothyroxine (SYNTHROID, LEVOTHROID) tablet 50 mcg  50 mcg Oral QAC breakfast Inez Catalina, MD   50 mcg at 10/08/16 [phone]  . metFORMIN (GLUCOPHAGE-XR) 24 hr tablet 500 mg  500 mg Oral Daily Jessica U Vann, DO   500 mg at 10/08/16 1051  . methylPREDNISolone sodium succinate (SOLU-MEDROL) 500 mg in sodium chloride 0.9 % 50 mL IVPB  500 mg Intravenous Q12H Rejeana Brock, MD   500 mg at 10/08/16 0601  . polyethylene glycol (MIRALAX / GLYCOLAX) packet 17 g  17 g Oral Daily PRN Inez Catalina, MD      . sodium chloride flush (NS) 0.9 % injection 3 mL  3 mL Intravenous Q12H Inez Catalina, MD   3 mL at 10/07/16 0920     Discharge Medications: Please see discharge summary for a list of discharge medications.  Relevant Imaging Results:  Relevant Lab Results:   Additional Information SSN: [id number]  Maree Krabbe, LCSW

## 2016-10-09 DIAGNOSIS — I10 Essential (primary) hypertension: Secondary | ICD-10-CM

## 2016-10-09 LAB — GLUCOSE, CAPILLARY
GLUCOSE-CAPILLARY: 317 mg/dL — AB (ref 65–99)
GLUCOSE-CAPILLARY: 211 mg/dL — AB (ref 65–99)
GLUCOSE-CAPILLARY: 228 mg/dL — AB (ref 65–99)
Glucose-Capillary: 175 mg/dL — ABNORMAL HIGH (ref 65–99)

## 2016-10-09 LAB — CBC
HEMATOCRIT: 40.6 % (ref 36.0–46.0)
HEMOGLOBIN: 13.4 g/dL (ref 12.0–15.0)
MCH: 28.8 pg (ref 26.0–34.0)
MCHC: 33 g/dL (ref 30.0–36.0)
MCV: 87.3 fL (ref 78.0–100.0)
Platelets: 302 10*3/uL (ref 150–400)
RBC: 4.65 MIL/uL (ref 3.87–5.11)
RDW: 14.7 % (ref 11.5–15.5)
WBC: 13.4 10*3/uL — AB (ref 4.0–10.5)

## 2016-10-09 LAB — BASIC METABOLIC PANEL
Anion gap: 9 (ref 5–15)
BUN: 21 mg/dL — ABNORMAL HIGH (ref 6–20)
CHLORIDE: 100 mmol/L — AB (ref 101–111)
CO2: 27 mmol/L (ref 22–32)
Calcium: 9.2 mg/dL (ref 8.9–10.3)
Creatinine, Ser: 0.57 mg/dL (ref 0.44–1.00)
GFR calc Af Amer: >60 mL/min (ref >60)
GFR calc non Af Amer: >60 mL/min (ref >60)
GLUCOSE: 191 mg/dL — AB (ref 65–99)
POTASSIUM: 3.5 mmol/L (ref 3.5–5.1)
Sodium: 136 mmol/L (ref 135–145)

## 2016-10-09 MED ORDER — INSULIN GLARGINE 100 UNIT/ML ~~LOC~~ SOLN
18.0000 [IU] | Freq: Every day | SUBCUTANEOUS | Status: DC
  Administered 2016-10-09 – 2016-10-10 (×2): 18 [IU] via SUBCUTANEOUS
  Filled 2016-10-09 (×3): qty 0.18

## 2016-10-09 MED ORDER — INSULIN ASPART 100 UNIT/ML ~~LOC~~ SOLN
4.0000 [IU] | Freq: Three times a day (TID) | SUBCUTANEOUS | Status: DC
  Administered 2016-10-09 – 2016-10-11 (×6): 4 [IU] via SUBCUTANEOUS

## 2016-10-09 NOTE — Progress Notes (Addendum)
PROGRESS NOTE    Christina Hobbs  ZOX:[id number] DOB: 08-23-1930 DOA: 10/05/2016 PCP: [name], MD   Outpatient Specialists:    Brief Narrative:  Christina Hobbs is a 83 y.o. female with medical history significant of hypothyroidism, HLD, HTN, murmur, DM2 who presents for confusion and altered mental status.  Found to have an UTI and vasogenic edema suggestive of amyloid.     Assessment & Plan:   Active Problems:   Hypothyroidism   Non-insulin treated type 2 diabetes mellitus (HCC)   UTI (urinary tract infection)   Hypertension   Vasogenic brain edema (HCC)   Senile brain amyloidosis (HCC)   UTI -ecoli-- pan sensitive -White blood cell counts back to baseline -Change to PO abx  Altered mental status with vasogenic edema of the brain MRI suggestive of amyloidosis s/p LP -steroids started- plan for 5 days total-- 4/5 today -patient appears to have an underlying dementia as well-- does not have capacity to make her own decisions (once infections treated, may benefit from outpatient neurology cognitive evaluation--- ambulatory referral placed)  Hypothyroid TSH normal  Type 2 diabetes Resume metformin Sliding-scale insulin Hemoglobin A1c: 6.8 Now that patient is on steroids, will plan to add low dose lantus and titrate for blood sugar control  Hypokalemia  repleted   DVT prophylaxis:  Lovenox   Code Status: Full Code   Family Communication: Called Sister (only family)- 4/4  Disposition Plan:     Consultants:   neuro     Subjective: No complaints  Objective: Vitals:   10/08/16 1356 10/08/16 2120 10/09/16 0554 10/09/16 0949  BP: (!) 127/57 (!) 156/59 (!) 158/54 120/63  Pulse: 65 (!) 58 (!) 48   Resp: Temp: 97.9 F (36.6 C) 97.8 F (36.6 C) 97.5 F (36.4 C)   TempSrc: Oral  Oral   SpO2: 99% 100% 99%   Weight:      Height:       No intake or output data in the 24 hours ending 10/09/16 1155 Filed Weights   10/05/16  1728  Weight: 55.8 kg (123 lb)    Examination:  General exam: awake and occasionally asks odd questions Respiratory system: Clear to auscultation. Respiratory effort normal. Cardiovascular system: S1 & S2 heard, RRR. No JVD, murmurs, rubs, gallops or clicks. No pedal edema. Gastrointestinal system: +BS, soft, NT Central nervous system:  Moving all 4 ext Skin: No rashes, lesions or ulcers     CBC:  Recent Labs Lab 10/05/16 1833 10/06/16 0256 10/07/16 0411 10/09/16 0611  WBC 11.3* 7.9 7.5 13.4*  NEUTROABS 8.7*  --   --   --   HGB 11.8* 11.0* 11.8* 13.4  HCT 36.3 33.5* 36.9 40.6  MCV 86.6 87.5 87.9 87.3  PLT 281 258 287 302   Basic Metabolic Panel:  Recent Labs Lab 10/05/16 1833 10/06/16 0256 10/06/16 0600 10/07/16 0411 10/09/16 0611  NA 134* 135  --  135 136  K 3.6 2.8*  --  4.4 3.5  CL 100* 96*  --  100* 100*  CO2 23 29  --  25 27  GLUCOSE 137* 114*  --  226* 191*  BUN 29* 15  --  20 21*  CREATININE 0.87 0.66  --  0.62 0.57  CALCIUM 9.2 9.2  --  9.2 9.2  MG  --   --  1.7  --   --    GFR: Estimated Creatinine Clearance: 46.1 mL/min (by C-G formula based on SCr of 0.57 mg/dL).  Liver Function Tests:  Recent Labs Lab 10/05/16 1833  AST 23  ALT 11*  ALKPHOS 76  BILITOT 0.8  PROT 6.9  ALBUMIN 3.7   No results for input(s): LIPASE, AMYLASE in the last 168 hours. No results for input(s): AMMONIA in the last 168 hours. Coagulation Profile: No results for input(s): INR, PROTIME in the last 168 hours. Cardiac Enzymes: No results for input(s): CKTOTAL, CKMB, CKMBINDEX, TROPONINI in the last 168 hours. BNP (last 3 results) No results for input(s): PROBNP in the last 8760 hours. HbA1C: No results for input(s): HGBA1C in the last 72 hours. CBG:  Recent Labs Lab 10/08/16 1159 10/08/16 1648 10/08/16 2117 10/09/16 0840 10/09/16 1149  GLUCAP 310* 215* 182* 175* 317*   Lipid Profile: No results for input(s): CHOL, HDL, LDLCALC, TRIG, CHOLHDL, LDLDIRECT  in the last 72 hours. Thyroid Function Tests: No results for input(s): TSH, T4TOTAL, FREET4, T3FREE, THYROIDAB in the last 72 hours. Anemia Panel: No results for input(s): VITAMINB12, FOLATE, FERRITIN, TIBC, IRON, RETICCTPCT in the last 72 hours. Urine analysis:    Component Value Date/Time   COLORURINE YELLOW 10/05/2016 1802   APPEARANCEUR CLOUDY (A) 10/05/2016 1802   LABSPEC 1.013 10/05/2016 1802   PHURINE 5.0 10/05/2016 1802   GLUCOSEU NEGATIVE 10/05/2016 1802   HGBUR MODERATE (A) 10/05/2016 1802   BILIRUBINUR NEGATIVE 10/05/2016 1802   KETONESUR 20 (A) 10/05/2016 1802   PROTEINUR 30 (A) 10/05/2016 1802   NITRITE NEGATIVE 10/05/2016 1802   LEUKOCYTESUR LARGE (A) 10/05/2016 1802     ) Recent Results (from the past 240 hour(s))  Urine culture     Status: Abnormal   Collection Time: 10/05/16  6:02 PM  Result Value Ref Range Status   Specimen Description URINE, CATHETERIZED  Final   Special Requests NONE  Final   Culture >=100,000 COLONIES/mL ESCHERICHIA COLI (A)  Final   Report Status 10/07/2016 FINAL  Final   Organism ID, Bacteria ESCHERICHIA COLI (A)  Final      Susceptibility   Escherichia coli - MIC*    AMPICILLIN 4 SENSITIVE Sensitive     CEFAZOLIN <=4 SENSITIVE Sensitive     CEFTRIAXONE <=1 SENSITIVE Sensitive     CIPROFLOXACIN <=0.25 SENSITIVE Sensitive     GENTAMICIN <=1 SENSITIVE Sensitive     IMIPENEM <=0.25 SENSITIVE Sensitive     NITROFURANTOIN <=16 SENSITIVE Sensitive     TRIMETH/SULFA <=20 SENSITIVE Sensitive     AMPICILLIN/SULBACTAM <=2 SENSITIVE Sensitive     PIP/TAZO <=4 SENSITIVE Sensitive     Extended ESBL NEGATIVE Sensitive     * >=100,000 COLONIES/mL ESCHERICHIA COLI  Fungus Culture With Stain     Status: None (Preliminary result)   Collection Time: 10/07/16  1:43 PM  Result Value Ref Range Status   Fungus Stain Final report  Final    Comment: (NOTE) Performed At: [facility] [address] [place], [place] [id number] Mila Homer  MD WU:[id number]    Fungus (Mycology) Culture PENDING  Incomplete   Fungal Source CSF  Final  Fungus Culture Result     Status: None   Collection Time: 10/07/16  1:43 PM  Result Value Ref Range Status   Result 1 Comment  Final    Comment: (NOTE) KOH/Calcofluor preparation:  no fungus observed. Performed At: [facility] [address] [place], [place] [id number] Mila Homer MD VH:[id number]       Anti-infectives    Start     Dose/Rate Route Frequency Ordered Stop   10/07/16 1230  cephALEXin (KEFLEX) capsule 500 mg     500 mg Oral Every 12 hours 10/07/16 1223     10/06/16 1800  cefTRIAXone (ROCEPHIN) 1 g in dextrose 5 % 50 mL IVPB  Status:  Discontinued     1 g 100 mL/hr over 30 Minutes Intravenous Every 24 hours 10/06/16 0012 10/07/16 1223   10/05/16 1915  cefTRIAXone (ROCEPHIN) 1 g in dextrose 5 % 50 mL IVPB     1 g 100 mL/hr over 30 Minutes Intravenous  Once 10/05/16 1912 10/05/16 2008       Radiology Studies: Dg Fluoro Guide Lumbar Puncture  Result Date: 10/07/2016 CLINICAL DATA:  Cognitive decline.  Cerebral amyloid angiopathy. EXAM: DIAGNOSTIC LUMBAR PUNCTURE UNDER FLUOROSCOPIC GUIDANCE FLUOROSCOPY TIME:  Fluoroscopy Time:  0 minutes 24 seconds PROCEDURE: Informed consent was obtained from the patient prior to the procedure, including potential complications of headache, allergy, and pain. With the patient prone, the lower back was prepped with Betadine. 1% Lidocaine was used for local anesthesia. Lumbar puncture was performed at the L4-5 level using a 20 gauge needle with return of clear colorless CSF. 19.5 Ml of CSF were obtained for laboratory studies. The patient tolerated the procedure well and there were no apparent complications. IMPRESSION: Lumbar puncture performed at the L4-5 level without complication. Electronically Signed   By: Francene Boyers M.D.   On: 10/07/2016 13:53        Scheduled Meds: . amLODipine  10 mg Oral Q breakfast  . cephALEXin   500 mg Oral Q12H  . cholecalciferol  2,000 Units Oral QPM  . insulin aspart  0-20 Units Subcutaneous TID WC  . insulin aspart  0-5 Units Subcutaneous QHS  . insulin glargine  10 Units Subcutaneous QHS  . levothyroxine  50 mcg Oral QAC breakfast  . metFORMIN  500 mg Oral Daily  . methylPREDNISolone (SOLU-MEDROL) injection  500 mg Intravenous Q12H  . sodium chloride flush  3 mL Intravenous Q12H   Continuous Infusions:   LOS: 3 days    Time spent: 15 min    Esra Frankowski U Shakora Nordquist, DO [facility] Pager [phone]  If 7PM-7AM, please contact night-coverage [email or web address] Password [email or web address] 10/09/2016, 11:55 AM

## 2016-10-09 NOTE — Progress Notes (Signed)
Occupational Therapy Treatment Patient Details Name: Christina Hobbs MRN: [id number] DOB: 24-Jun-1931 Today's Date: 10/09/2016    History of present illness Pt is an 83 y.o. female who presented with confusion and altered mental status and was found to have a UTI and vasogenic edema suggestive of amyloidosis on MRI. Pt has a PMH significant for hypothyroidism, hyperlipidemia, hypertension, murmur, and non-insulin treated type 2 diabetes mellitus.   OT comments  Pt demonstrated improved orientation and awareness this session but remains confused. She was able to follow one-step commands consistently and multi-step commands inconsistently this session. She demonstrated improved ability to perform LB dressing and bathing tasks with min guard assist this session. Pt continues to require VC's for safety throughout session. D/C plan remains appropriate. OT will continue to follow acutely.  Follow Up Recommendations  SNF;Supervision/Assistance - 24 hour    Equipment Recommendations  Other (comment) (TBD at next venue)    Recommendations for Other Services      Precautions / Restrictions Precautions Precautions: Fall Precaution Comments: decreased orientation and awareness Restrictions Weight Bearing Restrictions: No       Mobility Bed Mobility               General bed mobility comments: OOB in chair on arrival  Transfers Overall transfer level: Needs assistance Equipment used: Rolling walker (2 wheeled) Transfers: Sit to/from Stand Sit to Stand: Min assist         General transfer comment: Increased VC's for initiation.    Balance Overall balance assessment: Needs assistance Sitting-balance support: No upper extremity supported;Feet supported Sitting balance-Leahy Scale: Good     Standing balance support: Bilateral upper extremity supported;During functional activity;No upper extremity supported Standing balance-Leahy Scale: Fair Standing balance comment: Able to stand  at sink for grooming tasks without UE support.                           ADL either performed or assessed with clinical judgement   ADL Overall ADL's : Needs assistance/impaired     Grooming: Oral care;Min guard;Standing       Lower Body Bathing: Min guard;Sit to/from stand       Lower Body Dressing: Min guard;Sit to/from stand   Toilet Transfer: Min guard;Ambulation;BSC;RW   Toileting- Architect and Hygiene: Min guard;Sit to/from stand       Functional mobility during ADLs: Min guard;Rolling walker General ADL Comments: Pt with episode of urinary incontinence but able to report that this was occuring. Improved ability to follow commands and decreased fidgeting.     Vision   Vision Assessment?: No apparent visual deficits   Perception     Praxis      Cognition Arousal/Alertness: Awake/alert Behavior During Therapy: WFL for tasks assessed/performed Overall Cognitive Status: Impaired/Different from baseline Area of Impairment: Attention;Memory;Following commands;Safety/judgement;Awareness;Problem solving                   Current Attention Level: Focused Memory: Decreased short-term memory Following Commands: Follows one step commands consistently;Follows multi-step commands inconsistently Safety/Judgement: Decreased awareness of safety Awareness: Emergent Problem Solving: Slow processing;Difficulty sequencing;Requires verbal cues;Requires tactile cues General Comments: Improved orientation this session. Clear at times and remembering previous session yet at other times seeing things that were not in room and talking about unrelated topics. Pt fixated on events leading up to hospital and reporting that they occurred today.        Exercises     Shoulder Instructions  General Comments      Pertinent Vitals/ Pain       Pain Assessment: Faces Faces Pain Scale: Hurts a little bit Pain Intervention(s): Monitored during  session  Home Living                                          Prior Functioning/Environment              Frequency  Min 2X/week        Progress Toward Goals  OT Goals(current goals can now be found in the care plan section)  Progress towards OT goals: Progressing toward goals  Acute Rehab OT Goals Patient Stated Goal: None stated OT Goal Formulation: With patient Time For Goal Achievement: 10/20/16 Potential to Achieve Goals: Good ADL Goals Pt Will Perform Grooming: with supervision;standing (3 tasks) Pt Will Transfer to Toilet: with supervision;ambulating;bedside commode (BSC over toilet) Pt Will Perform Toileting - Clothing Manipulation and hygiene: with supervision;sit to/from stand Additional ADL Goal #1: Pt will independently demonstrate emertgent awareness during morning ADL routine in a minimally distracting environment. Additional ADL Goal #2: Pt will follow 3/4 one-step commands during ADL tasks to improve independence and safety with daily self-care tasks.  Plan Discharge plan remains appropriate    Co-evaluation                 End of Session Equipment Utilized During Treatment: Gait belt;Rolling walker  OT Visit Diagnosis: Unsteadiness on feet (R26.81);Other abnormalities of gait and mobility (R26.89);Other symptoms and signs involving cognitive function   Activity Tolerance Patient tolerated treatment well   Patient Left with call bell/phone within reach;in chair   Nurse Communication Mobility status        Time: 4098-1191 OT Time Calculation (min): 30 min  Charges: OT General Charges $OT Visit: 1 Procedure OT Treatments $Self Care/Home Management : 23-37 mins  Doristine Section, MS OTR/L  Pager: [phone]    Adilee Lemme A Joshlyn Beadle 10/09/2016, 6:00 PM

## 2016-10-09 NOTE — Progress Notes (Signed)
Subjective: More oriented today.   Exam: Vitals:   10/09/16 0554 10/09/16 0949  BP: (!) 158/54 120/63  Pulse: (!) 48   Resp: 18   Temp: 97.5 F (36.4 C)     HEENT-  Normocephalic, no lesions, without obvious abnormality.  Normal external eye and conjunctiva.  Normal TM's    Neuro: --aware to [facility], year and month. Asked about labs CN: Pupils are equal and round. They are symmetrically reactive from 3-->2 mm. EOMI without nystagmus. Facial sensation is intact to light touch. Face is symmetric at rest with normal strength and mobility. Hearing is intact to conversational voice. Palate elevates symmetrically and uvula is midline. Voice is normal in tone, pitch and quality. Bilateral SCM and trapezii are 5/5. Tongue is midline with normal bulk and mobility.  Motor: Normal bulk, tone, and strength. 5/5 throughout. No drift.  Sensation: Intact to light touch.  DTRs: 2+, symmetric  Toes downgoing bilaterally. No pathologic reflexes.  Coordination: Finger-to-nose and heel-to-shin are without dysmetria   Pertinent labs:  CSF wbc 2 CSF rbc 3 CSF protein 81  CSF glucose 114 CSF VDRL non-reactive CSF cytology --no malignant cells with scattered mononuclear cellss.  CSF fungal culture negative   Impression: 1. Inflammatory cerebral amyloid angiopathy (ICAA) 2. Acute encephalopathy, likely due to UTI +/- ICAA 3. Dementia, likely 2/2 ICAA  4. UTI  Recommendations:  Continue IV Solumedrol for a total of five days (Today is day #4/5). Continue to treat UTI and optimize metabolic status as you are. Avoid benzos, opiates, and anything with strong anticholinergic properties.   Felicie Morn PA-C [facility] [phone] 10/09/2016, 11:06 AM

## 2016-10-10 DIAGNOSIS — I680 Cerebral amyloid angiopathy: Secondary | ICD-10-CM

## 2016-10-10 DIAGNOSIS — E854 Organ-limited amyloidosis: Secondary | ICD-10-CM

## 2016-10-10 LAB — GLUCOSE, CAPILLARY
GLUCOSE-CAPILLARY: 141 mg/dL — AB (ref 65–99)
GLUCOSE-CAPILLARY: 161 mg/dL — AB (ref 65–99)
Glucose-Capillary: 326 mg/dL — ABNORMAL HIGH (ref 65–99)
Glucose-Capillary: 185 mg/dL — ABNORMAL HIGH (ref 65–99)

## 2016-10-10 MED ORDER — ENOXAPARIN SODIUM 40 MG/0.4ML ~~LOC~~ SOLN
40.0000 mg | SUBCUTANEOUS | Status: DC
  Administered 2016-10-10 – 2016-10-11 (×2): 40 mg via SUBCUTANEOUS
  Filled 2016-10-10 (×2): qty 0.4

## 2016-10-10 NOTE — Progress Notes (Signed)
qPhysical Therapy Treatment Patient Details Name: Christina Hobbs MRN: [id number] DOB: 07-16-1930 Today's Date: 10/10/2016    History of Present Illness Pt is an 83 y.o. female who presented with confusion and altered mental status and was found to have a UTI and vasogenic edema suggestive of amyloidosis on MRI. Pt has a PMH significant for hypothyroidism, hyperlipidemia, hypertension, murmur, and non-insulin treated type 2 diabetes mellitus.    PT Comments    Pt performed increased gait during session.  Multiple LOB and remains to present with cognitive deficits which limits carryover with safe mobility.  Pt performed transfer and gait training during today's session.  Pt will continues to benefit from  SNF placement for short term rehab.   Follow Up Recommendations  SNF;Supervision/Assistance - 24 hour     Equipment Recommendations  None recommended by PT    Recommendations for Other Services       Precautions / Restrictions Precautions Precautions: Fall Precaution Comments: decreased orientation and awareness Restrictions Weight Bearing Restrictions: No    Mobility  Bed Mobility Overal bed mobility: Needs Assistance Bed Mobility: Supine to Sit;Sit to Supine     Supine to sit: Min assist     General bed mobility comments: Assist to advance B LEs to edge of bed.  Pt required assistance for upper trunk control.    Transfers Overall transfer level: Needs assistance Equipment used: None Transfers: Sit to/from Stand Sit to Stand: Mod assist         General transfer comment: Pt required assistance to boost into standing, increased assistance with increased time from lower seated surface.     Ambulation/Gait Ambulation/Gait assistance: Min assist;Mod assist (LOB to THe left x3.  ) Ambulation Distance (Feet): 150 Feet Assistive device: None Gait Pattern/deviations: Step-through pattern;Scissoring;Ataxic;Narrow base of support Gait velocity: decreased Gait velocity  interpretation: Below normal speed for age/gender General Gait Details: No buckling observed.  Increased assist to maintain balance during Lateral LOB to the L.     Stairs            Wheelchair Mobility    Modified Rankin (Stroke Patients Only)       Balance Overall balance assessment: Needs assistance Sitting-balance support: No upper extremity supported;Feet supported Sitting balance-Leahy Scale: Good       Standing balance-Leahy Scale: Fair Standing balance comment: Able to stand at sink for grooming tasks without UE support.                            Cognition Arousal/Alertness: Awake/alert Behavior During Therapy: Impulsive Overall Cognitive Status: Impaired/Different from baseline Area of Impairment: Attention;Memory;Following commands;Safety/judgement;Awareness;Problem solving                 Orientation Level: Disoriented to;Place;Time;Situation Current Attention Level: Focused Memory: Decreased short-term memory Following Commands: Follows one step commands consistently;Follows multi-step commands inconsistently Safety/Judgement: Decreased awareness of safety Awareness: Emergent Problem Solving: Slow processing;Difficulty sequencing;Requires verbal cues;Requires tactile cues General Comments: Pt reports she does not know how she got wet (urinated in bed).  Pt's attention bouncing from topic to topic.  Required repeated cues for task.        Exercises      General Comments        Pertinent Vitals/Pain Pain Assessment: No/denies pain    Home Living                      Prior Function  PT Goals (current goals can now be found in the care plan section) Acute Rehab PT Goals Patient Stated Goal: None stated Potential to Achieve Goals: Fair Progress towards PT goals: Progressing toward goals    Frequency    Min 3X/week      PT Plan Current plan remains appropriate    Co-evaluation              End of Session Equipment Utilized During Treatment: Gait belt Activity Tolerance: Patient tolerated treatment well Patient left: in bed;with call bell/phone within reach;with bed alarm set;Other (comment) Nurse Communication: Mobility status PT Visit Diagnosis: Unsteadiness on feet (R26.81);Other abnormalities of gait and mobility (R26.89);Muscle weakness (generalized) (M62.81);Pain Pain - Right/Left: Right Pain - part of body: Knee     Time: 1323-1350 PT Time Calculation (min) (ACUTE ONLY): 27 min  Charges:  $Gait Training: 8-22 mins $Therapeutic Activity: 8-22 mins                    G Codes:       Joycelyn Rua, PTA pager [phone]    Florestine Avers 10/10/2016, 2:04 PM

## 2016-10-10 NOTE — Progress Notes (Signed)
Subjective: Still more alert and minimally confabulating.  Is aware her labs show now etiology for dementia  Exam: Vitals:   10/09/16 2203 10/10/16 0613  BP: 136/61 (!) 145/60  Pulse: (!) 59 64  Resp: 18 18  Temp: 97.7 F (36.5 C) 97.7 F (36.5 C)    Neuro:--Aware to [employer], year and month.  WG:NFAOZH are equal and round. They are symmetrically reactive from 3-->2 mm. EOMI without nystagmus. Facial sensation is intact to light touch. Face is symmetric at rest with normal strength and mobility. Hearing is intact to conversational voice. Palate elevates symmetrically and uvula is midline. Voice is normal in tone, pitch and quality. Bilateral SCM and trapezii are 5/5. Tongue is midline with normal bulk and mobility.  Motor:Normal bulk, tone, and strength. 5/5 throughout.No drift.  Sensation: Intact to light touch.  DTRs:2+, symmetric  Toes downgoing bilaterally. No pathologic reflexes.  Coordination: Finger-to-nose and heel-to-shin are without dysmetria     Pertinent Labs/Diagnostics: none  Felicie Morn PA-C [facility] [phone]  Impression: 1. Inflammatory cerebral amyloid angiopathy (ICAA) 2. Acute encephalopathy, likely due to UTI +/- ICAA 3. Dementia, likely 2/2 ICAA  4. UTI  Recommendations:  Continue IV Solumedrol for a total of five days (Today is day #4/5). On Keflex for UTI.    10/10/2016, 10:28 AM

## 2016-10-10 NOTE — Progress Notes (Signed)
PROGRESS NOTE    Christina Hobbs  ZOX:[id number] DOB: May 21, 1931 DOA: 10/05/2016 PCP: [name], MD   Brief Narrative: 83 y.o.femalewith medical history significant of hypothyroidism, HLD, HTN, murmur, DM2 who presents for confusion and altered mental status.  Found to have an UTI and vasogenic edema suggestive of amyloid.  Assessment & Plan:  #  Acute encephalopathy likely in the setting of UTI and ICAA: -mental status improving.  -on solumedrol and keflex -PT/OT and likely dc to SNF  # Inflammatory cerebral amyloid angiopathy: Pt has vasogenic edema of brain, s/p LP. Neurology following. On IV solumderol, likely completing 5 days course tomorrow. Discussed with pharmacist.  -f/u neurologist's plan.  # Hypothyroidism: Continue Synthroid.  #Type 2 diabetes: Continue sliding scale. Monitor blood sugar level.  #Hypokalemia: Improved  #Hypertension: Continue Norvasc. Monitor blood pressure closely.  #Leukocytosis: Likely due to steroid.  #UTI, site unspecified: continue keflex,   Active Problems:   Hypothyroidism   Non-insulin treated type 2 diabetes mellitus (HCC)   UTI (urinary tract infection)   Hypertension   Vasogenic brain edema (HCC)   Senile brain amyloidosis (HCC)  DVT prophylaxis:lovenox sq Code Status:full Family Communication:No family present at bedside  Disposition Plan:Likely discharge to SNF in 1-2 days. Discussions social worker    Consultants:   neurologist  Subjective: Patient was seen and examined at bedside. This was allowed awake and oriented. Denied headache, dizziness, chest pain, shortness of breath.  Objective: Vitals:   10/09/16 0949 10/09/16 1526 10/09/16 2203 10/10/16 0613  BP: 120/63 124/60 136/61 (!) 145/60  Pulse:  (!) 58 (!) 59 64  Resp:  Temp:  97.6 F (36.4 C) 97.7 F (36.5 C) 97.7 F (36.5 C)  TempSrc:  Oral  Oral  SpO2:  99% 98% 99%  Weight:      Height:       No intake or output data in the 24  hours ending 10/10/16 1447 Filed Weights   10/05/16 1728  Weight: 55.8 kg (123 lb)    Examination:  General exam: Appears calm and comfortable  Respiratory system: Clear to auscultation. Respiratory effort normal. No wheezing or crackle Cardiovascular system: S1 & S2 heard, RRR.  No pedal edema. Gastrointestinal system: Abdomen is nondistended, soft and nontender. Normal bowel sounds heard. Central nervous system: Alert and oriented.  Skin: No rashes, lesions or ulcers    Data Reviewed: I have personally reviewed following labs and imaging studies  CBC:  Recent Labs Lab 10/05/16 1833 10/06/16 0256 10/07/16 0411 10/09/16 0611  WBC 11.3* 7.9 7.5 13.4*  NEUTROABS 8.7*  --   --   --   HGB 11.8* 11.0* 11.8* 13.4  HCT 36.3 33.5* 36.9 40.6  MCV 86.6 87.5 87.9 87.3  PLT 281 258 287 302   Basic Metabolic Panel:  Recent Labs Lab 10/05/16 1833 10/06/16 0256 10/06/16 0600 10/07/16 0411 10/09/16 0611  NA 134* 135  --  135 136  K 3.6 2.8*  --  4.4 3.5  CL 100* 96*  --  100* 100*  CO2 23 29  --  25 27  GLUCOSE 137* 114*  --  226* 191*  BUN 29* 15  --  20 21*  CREATININE 0.87 0.66  --  0.62 0.57  CALCIUM 9.2 9.2  --  9.2 9.2  MG  --   --  1.7  --   --    GFR: Estimated Creatinine Clearance: 46.1 mL/min (by C-G formula based on SCr of 0.57 mg/dL). Liver  Function Tests:  Recent Labs Lab 10/05/16 1833  AST 23  ALT 11*  ALKPHOS 76  BILITOT 0.8  PROT 6.9  ALBUMIN 3.7   No results for input(s): LIPASE, AMYLASE in the last 168 hours. No results for input(s): AMMONIA in the last 168 hours. Coagulation Profile: No results for input(s): INR, PROTIME in the last 168 hours. Cardiac Enzymes: No results for input(s): CKTOTAL, CKMB, CKMBINDEX, TROPONINI in the last 168 hours. BNP (last 3 results) No results for input(s): PROBNP in the last 8760 hours. HbA1C: No results for input(s): HGBA1C in the last 72 hours. CBG:  Recent Labs Lab 10/09/16 1149 10/09/16 1628  10/09/16 2205 10/10/16 0810 10/10/16 1203  GLUCAP 317* 211* 228* 141* 161*   Lipid Profile: No results for input(s): CHOL, HDL, LDLCALC, TRIG, CHOLHDL, LDLDIRECT in the last 72 hours. Thyroid Function Tests: No results for input(s): TSH, T4TOTAL, FREET4, T3FREE, THYROIDAB in the last 72 hours. Anemia Panel: No results for input(s): VITAMINB12, FOLATE, FERRITIN, TIBC, IRON, RETICCTPCT in the last 72 hours. Sepsis Labs:  Recent Labs Lab 10/05/16 1845 10/05/16 2036  LATICACIDVEN 1.19 0.70    Recent Results (from the past 240 hour(s))  Urine culture     Status: Abnormal   Collection Time: 10/05/16  6:02 PM  Result Value Ref Range Status   Specimen Description URINE, CATHETERIZED  Final   Special Requests NONE  Final   Culture >=100,000 COLONIES/mL ESCHERICHIA COLI (A)  Final   Report Status 10/07/2016 FINAL  Final   Organism ID, Bacteria ESCHERICHIA COLI (A)  Final      Susceptibility   Escherichia coli - MIC*    AMPICILLIN 4 SENSITIVE Sensitive     CEFAZOLIN <=4 SENSITIVE Sensitive     CEFTRIAXONE <=1 SENSITIVE Sensitive     CIPROFLOXACIN <=0.25 SENSITIVE Sensitive     GENTAMICIN <=1 SENSITIVE Sensitive     IMIPENEM <=0.25 SENSITIVE Sensitive     NITROFURANTOIN <=16 SENSITIVE Sensitive     TRIMETH/SULFA <=20 SENSITIVE Sensitive     AMPICILLIN/SULBACTAM <=2 SENSITIVE Sensitive     PIP/TAZO <=4 SENSITIVE Sensitive     Extended ESBL NEGATIVE Sensitive     * >=100,000 COLONIES/mL ESCHERICHIA COLI  Fungus Culture With Stain     Status: None (Preliminary result)   Collection Time: 10/07/16  1:43 PM  Result Value Ref Range Status   Fungus Stain Final report  Final    Comment: (NOTE) Performed At: [facility] [address] [place], [place] [id number] Mila Homer MD WU:[id number]    Fungus (Mycology) Culture PENDING  Incomplete   Fungal Source CSF  Final  Fungus Culture Result     Status: None   Collection Time: 10/07/16  1:43 PM  Result Value Ref Range  Status   Result 1 Comment  Final    Comment: (NOTE) KOH/Calcofluor preparation:  no fungus observed. Performed At: [facility] [address] [place], [place] [id number] Mila Homer MD VH:[id number]          Radiology Studies: No results found.      Scheduled Meds: . amLODipine  10 mg Oral Q breakfast  . cephALEXin  500 mg Oral Q12H  . cholecalciferol  2,000 Units Oral QPM  . insulin aspart  0-20 Units Subcutaneous TID WC  . insulin aspart  0-5 Units Subcutaneous QHS  . insulin aspart  4 Units Subcutaneous TID WC  . insulin glargine  18 Units Subcutaneous QHS  . levothyroxine  50 mcg Oral QAC breakfast  .  metFORMIN  500 mg Oral Daily  . methylPREDNISolone (SOLU-MEDROL) injection  500 mg Intravenous Q12H  . sodium chloride flush  3 mL Intravenous Q12H   Continuous Infusions:   LOS: 4 days    Domani Bakos Jaynie Collins, MD [facility] Pager [phone]  If 7PM-7AM, please contact night-coverage [email or web address] Password [facility] 10/10/2016, 2:47 PM

## 2016-10-10 NOTE — Care Management Important Message (Signed)
Important Message  Patient Details  Name: Christina Hobbs MRN: [id number] Date of Birth: [birth year]   Medicare Important Message Given:  Yes    Yoshua [facility] Stefan Church 10/10/2016, 11:37 AM

## 2016-10-11 DIAGNOSIS — R410 Disorientation, unspecified: Secondary | ICD-10-CM | POA: Diagnosis not present

## 2016-10-11 DIAGNOSIS — R4182 Altered mental status, unspecified: Secondary | ICD-10-CM | POA: Diagnosis not present

## 2016-10-11 DIAGNOSIS — E039 Hypothyroidism, unspecified: Secondary | ICD-10-CM | POA: Diagnosis not present

## 2016-10-11 DIAGNOSIS — E119 Type 2 diabetes mellitus without complications: Secondary | ICD-10-CM | POA: Diagnosis not present

## 2016-10-11 DIAGNOSIS — E854 Organ-limited amyloidosis: Secondary | ICD-10-CM | POA: Diagnosis not present

## 2016-10-11 DIAGNOSIS — I10 Essential (primary) hypertension: Secondary | ICD-10-CM | POA: Diagnosis not present

## 2016-10-11 DIAGNOSIS — G936 Cerebral edema: Secondary | ICD-10-CM | POA: Diagnosis not present

## 2016-10-11 DIAGNOSIS — E038 Other specified hypothyroidism: Secondary | ICD-10-CM | POA: Diagnosis not present

## 2016-10-11 DIAGNOSIS — S0990XA Unspecified injury of head, initial encounter: Secondary | ICD-10-CM | POA: Diagnosis not present

## 2016-10-11 DIAGNOSIS — N390 Urinary tract infection, site not specified: Secondary | ICD-10-CM | POA: Diagnosis not present

## 2016-10-11 DIAGNOSIS — I680 Cerebral amyloid angiopathy: Secondary | ICD-10-CM | POA: Diagnosis not present

## 2016-10-11 LAB — GLUCOSE, CAPILLARY
Glucose-Capillary: 151 mg/dL — ABNORMAL HIGH (ref 65–99)
Glucose-Capillary: 305 mg/dL — ABNORMAL HIGH (ref 65–99)

## 2016-10-11 MED ORDER — INSULIN ASPART 100 UNIT/ML ~~LOC~~ SOLN: 0.0000 [IU] | Freq: Three times a day (TID) | SUBCUTANEOUS | 11 refills | Status: DC

## 2016-10-11 MED ORDER — CEPHALEXIN 500 MG PO CAPS: 500.0000 mg | ORAL_CAPSULE | Freq: Two times a day (BID) | ORAL | 0 refills | Status: AC

## 2016-10-11 NOTE — Clinical Social Work Placement (Signed)
   CLINICAL SOCIAL WORK PLACEMENT  NOTE  Date:  10/11/2016  Patient Details  Name: Tova Vater MRN: [id number] Date of Birth: 05-06-1931  Clinical Social Work is seeking post-discharge placement for this patient at the Skilled  Nursing Facility level of care (*CSW will initial, date and re-position this form in  chart as items are completed):      Patient/family provided with [facility] Clinical Social Work Department's list of facilities offering this level of care within the geographic area requested by the patient (or if unable, by the patient's family).  Yes   Patient/family informed of their freedom to choose among providers that offer the needed level of care, that participate in Medicare, Medicaid or managed care program needed by the patient, have an available bed and are willing to accept the patient.  Yes   Patient/family informed of [facility]'s ownership interest in [facility] and [facility], as well as of the fact that they are under no obligation to receive care at these facilities.  PASRR submitted to EDS on       PASRR number received on 10/08/16     Existing PASRR number confirmed on       FL2 transmitted to all facilities in geographic area requested by pt/family on 10/08/16     FL2 transmitted to all facilities within larger geographic area on       Patient informed that his/her managed care company has contracts with or will negotiate with certain facilities, including the following:        Yes   Patient/family informed of bed offers received.  Patient chooses bed at       Physician recommends and patient chooses bed at      Patient to be transferred to [facility] on 10/09/16.  Patient to be transferred to facility by [facility]     Patient family notified on 10/11/16 of transfer.  Name of family member notified:  Sister     PHYSICIAN Please prepare prescriptions, Please sign FL2     Additional Comment:     _______________________________________________ Mearl Latin, LCSWA 10/11/2016, 3:11 PM

## 2016-10-11 NOTE — H&P (Signed)
Report given to [facility] facility.

## 2016-10-11 NOTE — Progress Notes (Signed)
qPhysical Therapy Treatment Patient Details Name: Christina Hobbs MRN: [id number] DOB: 1931-01-14 Today's Date: 10/11/2016    History of Present Illness Pt is an 83 y.o. female who presented with confusion and altered mental status and was found to have a UTI and vasogenic edema suggestive of amyloidosis on MRI. Pt has a PMH significant for hypothyroidism, hyperlipidemia, hypertension, murmur, and non-insulin treated type 2 diabetes mellitus.    PT Comments    Pt performed increased activity during session.  Pt remains incontinent of urine when standing.  Pt remains to present with cognitive deficits.  Pt required constant cues for redirection and safety.  Plan to d/c to [facility] this pm.     Follow Up Recommendations  SNF;Supervision/Assistance - 24 hour     Equipment Recommendations  None recommended by PT    Recommendations for Other Services       Precautions / Restrictions Precautions Precautions: Fall Precaution Comments: decreased orientation and awareness Restrictions Weight Bearing Restrictions: No    Mobility  Bed Mobility Overal bed mobility: Needs Assistance Bed Mobility: Supine to Sit;Sit to Supine     Supine to sit: Min assist     General bed mobility comments: Assist to advance B LEs to edge of bed.  Pt required assistance for upper trunk control.    Transfers Overall transfer level: Needs assistance Equipment used: Rolling walker (2 wheeled) Transfers: Sit to/from Stand Sit to Stand: Mod assist         General transfer comment: Pt required assistance to boost into standing, increased assistance with increased time from lower seated surface.   Pt began to urinate and unable to follow commands to sit in time to not urinate in the floor.    Ambulation/Gait Ambulation/Gait assistance: Min assist Ambulation Distance (Feet): 200 Feet Assistive device: None Gait Pattern/deviations: Step-through pattern;Ataxic;Narrow base of support;Decreased stride  length     General Gait Details: Pt required decreased assist with RW.  Cues to redirect RW and maintain hand placement for proper use.     Stairs            Wheelchair Mobility    Modified Rankin (Stroke Patients Only)       Balance Overall balance assessment: Needs assistance   Sitting balance-Leahy Scale: Good     Standing balance support: Bilateral upper extremity supported;During functional activity;No upper extremity supported Standing balance-Leahy Scale: Fair Standing balance comment: Able to stand at sink for grooming tasks without UE support.                            Cognition Arousal/Alertness: Awake/alert Behavior During Therapy: Impulsive Overall Cognitive Status: Impaired/Different from baseline                   Orientation Level: Disoriented to;Place;Time;Situation Current Attention Level: Focused Memory: Decreased short-term memory Following Commands: Follows one step commands consistently;Follows multi-step commands inconsistently Safety/Judgement: Decreased awareness of safety Awareness: Emergent Problem Solving: Slow processing;Difficulty sequencing;Requires verbal cues;Requires tactile cues General Comments: Reports her socks are pink despite being yellow.  Reports she went to a party last night.        Exercises      General Comments        Pertinent Vitals/Pain Pain Assessment: No/denies pain Faces Pain Scale: Hurts a little bit Pain Location: Rt knee Pain Descriptors / Indicators: Grimacing Pain Intervention(s): Monitored during session    Home Living  Prior Function            PT Goals (current goals can now be found in the care plan section) Acute Rehab PT Goals Patient Stated Goal: None stated Potential to Achieve Goals: Fair Progress towards PT goals: Progressing toward goals    Frequency    Min 3X/week      PT Plan Current plan remains appropriate     Co-evaluation             End of Session Equipment Utilized During Treatment: Gait belt Activity Tolerance: Patient tolerated treatment well Patient left: with call bell/phone within reach;Other (comment);in chair;with chair alarm set Nurse Communication: Mobility status PT Visit Diagnosis: Unsteadiness on feet (R26.81);Other abnormalities of gait and mobility (R26.89);Muscle weakness (generalized) (M62.81);Pain Pain - Right/Left: Right Pain - part of body: Knee     Time: 4098-1191 PT Time Calculation (min) (ACUTE ONLY): 24 min  Charges:  $Gait Training: 8-22 mins $Therapeutic Activity: 8-22 mins                    G Codes:       Joycelyn Rua, PTA pager [phone]    Florestine Avers 10/11/2016, 4:09 PM

## 2016-10-11 NOTE — Discharge Summary (Signed)
Physician Discharge Summary  Christina Hobbs ZOX:[id number] DOB: May 08, 1931 DOA: 10/05/2016  PCP: [name], MD  Admit date: 10/05/2016 Discharge date: 10/11/2016  Admitted From:Home Disposition:SNF  Recommendations for Outpatient Follow-up:  1. Follow up with PCP in 1-2 weeks 2. Please obtain BMP/CBC in one week  Home Health:SNF Equipment/Devices:no Discharge Condition:stable CODE STATUS:full Diet recommendation:heart healthy  Brief/Interim Summary: #  Acute encephalopathy likely in the setting of UTI and ICAA: -mental status improved.  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 Pt completed 5 days of IV solumderol. Discussed with pharmacist.  -Outpatient follow-up with neurologist recommended.  # Hypothyroidism: Continue Synthroid.  #Type 2 diabetes: Continue sliding scale. On metformin. Hyperglycemia in the hospital likely contributed by steroid use.  #Hypokalemia: Improved  #Hypertension: Continue Norvasc. Monitor blood pressure closely.  #Leukocytosis: Likely due to steroid.  #UTI, site unspecified: continue keflex to complete the course.  Patient is clinically improved. Her mental status improved. Completed IV Solu-Medrol as per neurologist. Evaluated by PT OT recommended a skilled. Patient is medically stable to transfer her care to outpatient. I recommended patient to follow-up with PCP and neurologist. Discussed with the social worker regarding discharge plan.  Discharge Diagnoses:  Active Problems:   Hypothyroidism   Non-insulin treated type 2 diabetes mellitus (HCC)   UTI (urinary tract infection)   Hypertension   Vasogenic brain edema (HCC)   Senile brain amyloidosis (HCC)   Cerebral amyloid angiopathy [facility])    Discharge Instructions  Discharge Instructions    Activity as tolerated - No restrictions     Complete by:  As directed    Ambulatory referral to Neurology    Complete by:  As directed    An appointment is requested in approximately: dementia eval   Call MD for:  difficulty breathing, headache or visual disturbances    Complete by:  As directed    Call MD for:  extreme fatigue    Complete by:  As directed    Call MD for:  hives    Complete by:  As directed    Call MD for:  persistant dizziness or light-headedness    Complete by:  As directed    Call MD for:  persistant nausea and vomiting    Complete by:  As directed    Call MD for:  severe uncontrolled pain    Complete by:  As directed    Call MD for:  temperature >100.4    Complete by:  As directed    Diet - low sodium heart healthy    Complete by:  As directed    Increase activity slowly    Complete by:  As directed      Allergies as of 10/11/2016      Reactions   Zestril [lisinopril] Shortness Of Breath   Norvasc [amlodipine Besylate] Itching   Patient tolerates amlodipine and takes at home.    Tenormin [atenolol] Other (See Comments)   fatigue      Medication List    TAKE these medications   amLODipine 10 MG tablet Commonly known as:  NORVASC Take 10 mg by mouth daily.   cephALEXin 500 MG capsule Commonly known as:  KEFLEX Take 1 capsule (500 mg total) by mouth every 12 (twelve) hours.   diclofenac sodium 1 % Gel Commonly known as:  VOLTAREN Apply 2 g topically 3 (three) times daily as needed (PAIN).  What changed:  reasons to take this   insulin aspart 100 UNIT/ML injection Commonly known as:  novoLOG Inject 0-20 Units into the skin 3 (three) times daily with meals.   levothyroxine 50 MCG tablet Commonly known as:  SYNTHROID, LEVOTHROID Take 50 mcg by mouth daily before breakfast.   metFORMIN 500 MG 24 hr tablet Commonly known as:  GLUCOPHAGE-XR Take 500 mg by mouth daily.       Contact information for follow-up providers    [name], MD. Schedule an appointment as soon as possible  for a visit in 1 week(s).   Specialty:  Family Medicine Contact information: [address] [place] [address] [phone]            Contact information for after-discharge care    Destination    [facility] SNF Follow up.   Specialty:  Skilled Nursing Facility Contact information: [address] [place] [address] [phone]                 Allergies  Allergen Reactions  . Zestril [Lisinopril] Shortness Of Breath  . Norvasc [Amlodipine Besylate] Itching    Patient tolerates amlodipine and takes at home.   . Tenormin [Atenolol] Other (See Comments)    fatigue    Consultations: Neurologist.  Subjective: Patient was seen and examined at bedside. She was alert  awake and oriented. Denied headache, dizziness, chest pain, shortness of breath.  Discharge Exam: Vitals:   10/11/16 0546 10/11/16 0827  BP: (!) 149/74 (!) 144/60  Pulse: (!) 57 60  Resp: 16 16  Temp:  97.8 F (36.6 C)   Vitals:   10/10/16 1504 10/10/16 2228 10/11/16 0546 10/11/16 0827  BP: (!) 104/49 132/81 (!) 149/74 (!) 144/60  Pulse: 63 (!) 56 (!) 57 60  Resp: Temp: 98.2 F (36.8 C) 97.5 F (36.4 C)  97.8 F (36.6 C)  TempSrc: Oral Oral  Oral  SpO2: 92% 99% 99% 98%  Weight:      Height:        General: Pt is alert, awake, not in acute distress Cardiovascular: RRR, S1/S2 +, no rubs, no gallops Respiratory: CTA bilaterally, no wheezing, no rhonchi Abdominal: Soft, NT, ND, bowel sounds + Extremities: no edema, no cyanosis    The results of significant diagnostics from this hospitalization (including imaging, microbiology, ancillary and laboratory) are listed below for reference.     Microbiology: Recent Results (from the past 240 hour(s))  Urine culture     Status: Abnormal   Collection Time: 10/05/16  6:02 PM  Result Value Ref Range Status   Specimen Description URINE, CATHETERIZED  Final   Special Requests NONE   Final   Culture >=100,000 COLONIES/mL ESCHERICHIA COLI (A)  Final   Report Status 10/07/2016 FINAL  Final   Organism ID, Bacteria ESCHERICHIA COLI (A)  Final      Susceptibility   Escherichia coli - MIC*    AMPICILLIN 4 SENSITIVE Sensitive     CEFAZOLIN <=4 SENSITIVE Sensitive     CEFTRIAXONE <=1 SENSITIVE Sensitive     CIPROFLOXACIN <=0.25 SENSITIVE Sensitive     GENTAMICIN <=1 SENSITIVE Sensitive     IMIPENEM <=0.25 SENSITIVE Sensitive     NITROFURANTOIN <=16 SENSITIVE Sensitive     TRIMETH/SULFA <=20 SENSITIVE Sensitive     AMPICILLIN/SULBACTAM <=2 SENSITIVE Sensitive     PIP/TAZO <=4 SENSITIVE Sensitive     Extended ESBL NEGATIVE Sensitive     * >=  100,000 COLONIES/mL ESCHERICHIA COLI  Fungus Culture With Stain     Status: None (Preliminary result)   Collection Time: 10/07/16  1:43 PM  Result Value Ref Range Status   Fungus Stain Final report  Final    Comment: (NOTE) Performed At: [facility] [address] [place], [place] [id number] Mila Homer MD LK:[id number]    Fungus (Mycology) Culture PENDING  Incomplete   Fungal Source CSF  Final  Fungus Culture Result     Status: None   Collection Time: 10/07/16  1:43 PM  Result Value Ref Range Status   Result 1 Comment  Final    Comment: (NOTE) KOH/Calcofluor preparation:  no fungus observed. Performed At: [facility] [address] [place], [place] [id number] Mila Homer MD QV:[id number]      Labs: BNP (last 3 results)  Recent Labs  02/29/16 1428  BNP 38.0   Basic Metabolic Panel:  Recent Labs Lab 10/05/16 1833 10/06/16 0256 10/06/16 0600 10/07/16 0411 10/09/16 0611  NA 134* 135  --  135 136  K 3.6 2.8*  --  4.4 3.5  CL 100* 96*  --  100* 100*  CO2 23 29  --  25 27  GLUCOSE 137* 114*  --  226* 191*  BUN 29* 15  --  20 21*  CREATININE 0.87 0.66  --  0.62 0.57  CALCIUM 9.2 9.2  --  9.2 9.2  MG  --   --  1.7  --   --    Liver Function Tests:  Recent Labs Lab 10/05/16 1833   AST 23  ALT 11*  ALKPHOS 76  BILITOT 0.8  PROT 6.9  ALBUMIN 3.7   No results for input(s): LIPASE, AMYLASE in the last 168 hours. No results for input(s): AMMONIA in the last 168 hours. CBC:  Recent Labs Lab 10/05/16 1833 10/06/16 0256 10/07/16 0411 10/09/16 0611  WBC 11.3* 7.9 7.5 13.4*  NEUTROABS 8.7*  --   --   --   HGB 11.8* 11.0* 11.8* 13.4  HCT 36.3 33.5* 36.9 40.6  MCV 86.6 87.5 87.9 87.3  PLT 281 258 287 302   Cardiac Enzymes: No results for input(s): CKTOTAL, CKMB, CKMBINDEX, TROPONINI in the last 168 hours. BNP: Invalid input(s): POCBNP CBG:  Recent Labs Lab 10/10/16 1203 10/10/16 1733 10/10/16 2231 10/11/16 0803 10/11/16 1238  GLUCAP 161* 326* 185* 151* 305*   D-Dimer No results for input(s): DDIMER in the last 72 hours. Hgb A1c No results for input(s): HGBA1C in the last 72 hours. Lipid Profile No results for input(s): CHOL, HDL, LDLCALC, TRIG, CHOLHDL, LDLDIRECT in the last 72 hours. Thyroid function studies No results for input(s): TSH, T4TOTAL, T3FREE, THYROIDAB in the last 72 hours.  Invalid input(s): FREET3 Anemia work up No results for input(s): VITAMINB12, FOLATE, FERRITIN, TIBC, IRON, RETICCTPCT in the last 72 hours. Urinalysis    Component Value Date/Time   COLORURINE YELLOW 10/05/2016 1802   APPEARANCEUR CLOUDY (A) 10/05/2016 1802   LABSPEC 1.013 10/05/2016 1802   PHURINE 5.0 10/05/2016 1802   GLUCOSEU NEGATIVE 10/05/2016 1802   HGBUR MODERATE (A) 10/05/2016 1802   BILIRUBINUR NEGATIVE 10/05/2016 1802   KETONESUR 20 (A) 10/05/2016 1802   PROTEINUR 30 (A) 10/05/2016 1802   NITRITE NEGATIVE 10/05/2016 1802   LEUKOCYTESUR LARGE (A) 10/05/2016 1802   Sepsis Labs Invalid input(s): PROCALCITONIN,  WBC,  LACTICIDVEN Microbiology Recent Results (from the past 240 hour(s))  Urine culture     Status: Abnormal   Collection Time:  10/05/16  6:02 PM  Result Value Ref Range Status   Specimen Description URINE, CATHETERIZED  Final    Special Requests NONE  Final   Culture >=100,000 COLONIES/mL ESCHERICHIA COLI (A)  Final   Report Status 10/07/2016 FINAL  Final   Organism ID, Bacteria ESCHERICHIA COLI (A)  Final      Susceptibility   Escherichia coli - MIC*    AMPICILLIN 4 SENSITIVE Sensitive     CEFAZOLIN <=4 SENSITIVE Sensitive     CEFTRIAXONE <=1 SENSITIVE Sensitive     CIPROFLOXACIN <=0.25 SENSITIVE Sensitive     GENTAMICIN <=1 SENSITIVE Sensitive     IMIPENEM <=0.25 SENSITIVE Sensitive     NITROFURANTOIN <=16 SENSITIVE Sensitive     TRIMETH/SULFA <=20 SENSITIVE Sensitive     AMPICILLIN/SULBACTAM <=2 SENSITIVE Sensitive     PIP/TAZO <=4 SENSITIVE Sensitive     Extended ESBL NEGATIVE Sensitive     * >=100,000 COLONIES/mL ESCHERICHIA COLI  Fungus Culture With Stain     Status: None (Preliminary result)   Collection Time: 10/07/16  1:43 PM  Result Value Ref Range Status   Fungus Stain Final report  Final    Comment: (NOTE) Performed At: [facility] [address] [place], [place] [id number] Mila Homer MD WU:[id number]    Fungus (Mycology) Culture PENDING  Incomplete   Fungal Source CSF  Final  Fungus Culture Result     Status: None   Collection Time: 10/07/16  1:43 PM  Result Value Ref Range Status   Result 1 Comment  Final    Comment: (NOTE) KOH/Calcofluor preparation:  no fungus observed. Performed At: [facility] [address] [place], [place] [id number] Mila Homer MD VH:[id number]      Time coordinating discharge: 28 minutes  SIGNED:   Maxie Barb, MD  [facility] 10/11/2016, 1:36 PM  If 7PM-7AM, please contact night-coverage [email or web address] Password [email or web address]

## 2016-10-11 NOTE — Care Management Note (Signed)
Case Management Note  Patient Details  Name: Christina Hobbs MRN: [id number] Date of Birth: 01/21/1931  Subjective/Objective:    Acute Encephalopathy,    Inflammatory cerebral amyloid angiopathy              Action/Plan: Discharge Planning: NCM spoke to pt and offered choice for HH/list provided. Pt requested [facility]. [facility] Liaison with new referral. Pt states she prefers to dc to home. She lives at home alone. Explained CSW spoke to her sister, POA and SNF at [facility] was decided. Pt agreeable to SNF -rehab. States she uses [facility] [employer] to get to her appts and go to pharmacy.   PCP Beverley Fiedler R     Expected Discharge Date:                  Expected Discharge Plan:  Skilled Nursing Facility  In-House Referral:  Clinical Social Work  Discharge planning Services  CM Consult  Post Acute Care Choice:  Home Health Choice offered to:  Patient  DME Arranged:  [name] DME Agency:  NA  HH Arranged:  NA HH Agency:  [facility]  Status of Service:  Completed, signed off  If discussed at Long Length of Stay Meetings, dates discussed:    Additional Comments:  Elliot Cousin, RN 10/11/2016, 11:48 AM

## 2016-10-11 NOTE — Progress Notes (Signed)
Patient will DC to: [facility] Anticipated DC date: 10/11/16 Family notified: Sister Transport by: Sharin Mons   Per MD patient ready for DC to [facility]. RN, patient, patient's family, and facility notified of DC. Discharge Summary sent to facility. RN given number for report. DC packet on chart. Ambulance transport requested for patient.   CSW signing off.  Cristobal Goldmann, [place] Clinical Social Worker [phone]

## 2016-10-15 ENCOUNTER — Encounter: Payer: Self-pay | Admitting: Adult Health

## 2016-10-15 ENCOUNTER — Non-Acute Institutional Stay (SKILLED_NURSING_FACILITY): Payer: Medicare Other | Admitting: Adult Health

## 2016-10-15 DIAGNOSIS — I10 Essential (primary) hypertension: Secondary | ICD-10-CM

## 2016-10-15 DIAGNOSIS — N390 Urinary tract infection, site not specified: Secondary | ICD-10-CM | POA: Diagnosis not present

## 2016-10-15 DIAGNOSIS — E119 Type 2 diabetes mellitus without complications: Secondary | ICD-10-CM

## 2016-10-15 DIAGNOSIS — G936 Cerebral edema: Secondary | ICD-10-CM | POA: Diagnosis not present

## 2016-10-15 DIAGNOSIS — I680 Cerebral amyloid angiopathy: Secondary | ICD-10-CM | POA: Diagnosis not present

## 2016-10-15 DIAGNOSIS — E038 Other specified hypothyroidism: Secondary | ICD-10-CM

## 2016-10-15 DIAGNOSIS — E854 Organ-limited amyloidosis: Secondary | ICD-10-CM | POA: Diagnosis not present

## 2016-10-15 NOTE — Progress Notes (Signed)
Location:   [facility] Room Number: 124 B Place of Service:  SNF (31)   CODE STATUS: Full Code  Allergies  Allergen Reactions  . Zestril [Lisinopril] Shortness Of Breath  . Norvasc [Amlodipine Besylate] Itching    Patient tolerates amlodipine and takes at home.   . Tenormin [Atenolol] Other (See Comments)    fatigue    Chief Complaint  Patient presents with  . Hospitalization Follow-up    Hospital Follow up    HPI:  She has been hospitalized for ICAA( inflammatory cerebral amyloid angiopathy). She is status post solumedrol therapy. She will need follow up with neurology. She was treated for an uti. She is here for short term rehab. At this time I highly doubt that she will return to her apartment but will require assisted living  environment. She is unable to fully participate in the hpi or ros. There are no nursing concerns at this time.    Past Medical History:  Diagnosis Date  . Cardiac murmur   . Dizziness   . Hyperlipidemia   . Hypertension   . Pre-diabetes   . Syncope and collapse   . Thyroid disease   . Vitamin D deficiency     Past Surgical History:  Procedure Laterality Date  . TONSILLECTOMY      Social History   Social History  . Marital status: Single    Spouse name: [name]  . Number of children: [name]  . Years of education: [name]   Occupational History  . Not on file.   Social History Main Topics  . Smoking status: Never Smoker  . Smokeless tobacco: Never Used  . Alcohol use No  . Drug use: No  . Sexual activity: Not on file   Other Topics Concern  . Not on file   Social History Narrative  . No narrative on file   Family History  Problem Relation Age of Onset  . Stroke Mother   . Alzheimer's disease Father       VITAL SIGNS BP (!) 142/64   Pulse 68   Temp 97.8 F (36.6 C)   Resp 18   Ht  (1.727 m)   Wt 123 lb (55.8 kg)   SpO2 97%   BMI 18.70 kg/m   Patient's Medications  New Prescriptions   No  medications on file  Previous Medications   AMLODIPINE (NORVASC) 10 MG TABLET    Take 10 mg by mouth daily.   CEPHALEXIN 500 MG TABLET    Take 500 mg by mouth every 12 (twelve) hours.   DICLOFENAC SODIUM (VOLTAREN) 1 % GEL    Apply 2 g topically 3 (three) times daily as needed.   INSULIN ASPART (NOVOLOG) 100 UNIT/ML INJECTION    Inject 8 Units into the skin. After meals   LEVOTHYROXINE (SYNTHROID, LEVOTHROID) 50 MCG TABLET    Take 50 mcg by mouth daily before breakfast.   METFORMIN (GLUCOPHAGE) 500 MG TABLET    Take 500 mg by mouth daily with breakfast.  Modified Medications   No medications on file  Discontinued Medications   AMLODIPINE (NORVASC) 10 MG TABLET    Take 10 mg by mouth daily.    DICLOFENAC SODIUM (VOLTAREN) 1 % GEL    Apply 2 g topically 3 (three) times daily as needed (PAIN).   INSULIN ASPART (NOVOLOG) 100 UNIT/ML INJECTION    Inject 0-20 Units into the skin 3 (three) times daily with meals.   LEVOTHYROXINE (SYNTHROID, LEVOTHROID) 50 MCG TABLET  Take 50 mcg by mouth daily before breakfast.   METFORMIN (GLUCOPHAGE-XR) 500 MG 24 HR TABLET    Take 500 mg by mouth daily.      SIGNIFICANT DIAGNOSTIC EXAMS  10-05-16: ct of head: Regions of extensive vasogenic edema in both cerebral hemispheres. Contrast-enhanced brain MRI is recommended to evaluate for underlying neoplasm or infection. No midline shift or acute intracranial hemorrhage.   10-05-16: chest x-ray: Mild hyperinflation without infiltrate. Aortic atherosclerosis.  10-06-16: mri of brain: Extensive vasogenic edema in both cerebral hemispheres. Extensive microhemorrhage throughout the cerebral cortex bilaterally with relative sparing of the occipital lobes. Mild leptomeningeal enhancement postcontrast infusion. No enhancing mass lesion.   Given the constellation of findings and history of indolent progressive confusion, cerebral amyloid with inflammation is considered the most likely diagnosis. Less likely considerations  include encephalitis. Metastatic disease not considered likely.    LABS REVIEWED:   10-05-16: wbc 11.3; hgb 11.8; hct 36.3; mcv 86.6; plt 281; glucose 137; bun 29; creat 0.87; k+ 3.6; na++ 134; ;liver normal albumin 3.7; tsh 0.493; hgb a1c 6.8; urine culture: e-coli: keflex 10-06-16: wbc 7.9; hgb 11.0; hct 33.5 ;mcv 87.5; plt 258; glucose 114; bun 15; creat 0.66; k+ 2.8; na++ 135; vit D 42.4 10-07-16: wbc 7.5; hgb 11.8; hct 36.9; mcv 87.9; plt 287; glucose 226; bun 20; creat 0.62; k+ 4.4; na++ 135 10-09-16: wbc 13.4; hgb 13.4; hct 40.6; mcv 87.3; plt 302; glucose 191; bun 21; creat 0.57; k+ 3.5; na++ 136    Review of Systems  Unable to perform ROS: Other (confusion and poor historian )   Physical Exam  Constitutional: No distress.  Eyes: Conjunctivae are normal.  Neck: Neck supple. No JVD present. No thyromegaly present.  Cardiovascular: Normal rate, regular rhythm and intact distal pulses.   Murmur heard. Respiratory: Effort normal and breath sounds normal. No respiratory distress. She has no wheezes.  GI: Soft. Bowel sounds are normal. She exhibits no distension. There is no tenderness.  Musculoskeletal: She exhibits no edema.  Able to move all extremities   Lymphadenopathy:    She has no cervical adenopathy.  Neurological: She is alert.  Not oriented to her surroundings   Skin: Skin is warm and dry. She is not diaphoretic.  Psychiatric: She has a normal mood and affect.     ASSESSMENT/ PLAN:  1. Hypertension: will continue norvasc 10 mg daily   2. Hypothyroidism: tsh is 0.493; will continue synthroid 50 mcg daily   3. Diabetes: hgb a1c 6.8; will continue novolog 8 units after meals; will continue metformin 500 mg daily   4. UTI: will complete keflex and will monitor   5. Cerebral amyloid angiopathy; vasogenic brain edema: did receive  solumedrol while in the hospital; will setup follow up with neurology.   Will check cbc;bmp   Time spent with patient  50  minutes >50% time  spent counseling; reviewing medical record; tests; labs; and developing future plan of care   Synthia Innocent NP [facility] Adult [facility]  Contact [phone] Monday through Friday 8am- 5pm  After hours call [phone]

## 2016-10-16 ENCOUNTER — Non-Acute Institutional Stay (SKILLED_NURSING_FACILITY): Payer: Medicare Other | Admitting: Internal Medicine

## 2016-10-16 ENCOUNTER — Encounter: Payer: Self-pay | Admitting: Internal Medicine

## 2016-10-16 DIAGNOSIS — E119 Type 2 diabetes mellitus without complications: Secondary | ICD-10-CM | POA: Diagnosis not present

## 2016-10-16 DIAGNOSIS — I10 Essential (primary) hypertension: Secondary | ICD-10-CM

## 2016-10-16 DIAGNOSIS — R410 Disorientation, unspecified: Secondary | ICD-10-CM

## 2016-10-16 DIAGNOSIS — E854 Organ-limited amyloidosis: Secondary | ICD-10-CM

## 2016-10-16 DIAGNOSIS — E038 Other specified hypothyroidism: Secondary | ICD-10-CM | POA: Diagnosis not present

## 2016-10-16 DIAGNOSIS — I680 Cerebral amyloid angiopathy: Secondary | ICD-10-CM

## 2016-10-16 LAB — BASIC METABOLIC PANEL
BUN: 19 mg/dL (ref 4–21)
Creatinine: 0.4 mg/dL — AB (ref 0.5–1.1)
GLUCOSE: 126 mg/dL
POTASSIUM: 4.2 mmol/L (ref 3.4–5.3)
SODIUM: 139 mmol/L (ref 137–147)

## 2016-10-16 LAB — CBC AND DIFFERENTIAL
HCT: 37 % (ref 36–46)
Hemoglobin: 12 g/dL (ref 12.0–16.0)
Neutrophils Absolute: 5 /uL
Platelets: 161 10*3/uL (ref 150–399)
WBC: 8 10*3/mL

## 2016-10-16 NOTE — Progress Notes (Signed)
Provider:  Einar Crow Location:   [facility] Room Number: 124/B Place of Service:  SNF (31)  PCP: [name], MD Patient Care Team: [name], MD as PCP - General (Family Medicine) [name], MD as Consulting Physician [facility] Medicine)  Extended Emergency Contact Information Primary Emergency Contact: Dorian Furnace, [place] Home Phone: [phone] Mobile Phone: [phone] Relation: Sister  Code Status: Full Code Goals of Care: Advanced Directive information Advanced Directives 10/16/2016  Does Patient Have a Medical Advance Directive? Yes  Type of Advance Directive (No Data)  Does patient want to make changes to medical advance directive? No - Patient declined  Would patient like information on creating a medical advance directive? No - Patient declined      Chief Complaint  Patient presents with  . New Admit To SNF    HPI: Patient is a 83 y.o. female seen today for admission to Facility for therapy.  She has h/o Hypothyroidism, Hyperlipidemia, Hypertension who was found confused out side her Apartment. Per her sister who lives in the facility in [place] is being problems with Confusion for past I year and has been worse for last few months. With increasing problems with Confusion and not able to take care of herself.  Her MRI of brain in the hospital showed  diffuse edema with extraordinarily extensive microhemorrhages throughout the cortex. Which was consistent with Cerebral Amyloid Angiopathy with Inflammation. LP was Negative. She was treated with high Dose of steroids and discharged to facility.  In here patient has wander guard. But she tried to go out yesterday and was forced to come to facility by the staff. Patient says that they were holding me so hard that it really upset her. Patient is also very upset that she cannot go home. She says she is fien and why are  they holding me here.  Past Medical History:  Diagnosis Date  . Cardiac murmur   . Dizziness   . Hyperlipidemia   . Hypertension   . Pre-diabetes   . Syncope and collapse   . Thyroid disease   . Vitamin D deficiency    Past Surgical History:  Procedure Laterality Date  . TONSILLECTOMY      reports that she has never smoked. She has never used smokeless tobacco. She reports that she does not drink alcohol or use drugs. Social History   Social History  . Marital status: Single    Spouse name: [name]  . Number of children: [name]  . Years of education: [name]   Occupational History  . Not on file.   Social History Main Topics  . Smoking status: Never Smoker  . Smokeless tobacco: Never Used  . Alcohol use No  . Drug use: No  . Sexual activity: Not on file   Other Topics Concern  . Not on file   Social History Narrative  . No narrative on file    Functional Status Survey:    Family History  Problem Relation Age of Onset  . Stroke Mother   . Alzheimer's disease Father     Health Maintenance  Topic Date Due  . FOOT EXAM  10/14/2017 (Originally 09/27/1940)  . OPHTHALMOLOGY EXAM  10/14/2017 (Originally 09/27/1940)  . URINE MICROALBUMIN  10/14/2017 (Originally 09/27/1940)  . DEXA SCAN  10/14/2017 (Originally 09/26/1997)  . TETANUS/TDAP  10/14/2017 (Originally 09/27/1949)  . PNA vac Low Risk Adult (1 of  2 - PCV13) 10/14/2017 (Originally 09/26/1997)  . INFLUENZA VACCINE  02/05/2017  . HEMOGLOBIN A1C  04/06/2017    Allergies  Allergen Reactions  . Zestril [Lisinopril] Shortness Of Breath  . Norvasc [Amlodipine Besylate] Itching    Patient tolerates amlodipine and takes at home.   . Tenormin [Atenolol] Other (See Comments)    fatigue    Allergies as of 10/16/2016      Reactions   Zestril [lisinopril] Shortness Of Breath   Norvasc [amlodipine Besylate] Itching   Patient tolerates amlodipine and takes at home.    Tenormin [atenolol] Other (See Comments)   fatigue        Medication List       Accurate as of 10/16/16 11:29 AM. Always use your most recent med list.          amLODipine 10 MG tablet Commonly known as:  NORVASC Take 10 mg by mouth daily.   Cephalexin 500 MG tablet Take 500 mg by mouth every 12 (twelve) hours.   diclofenac sodium 1 % Gel Commonly known as:  VOLTAREN Apply 2 g topically 3 (three) times daily as needed.   levothyroxine 50 MCG tablet Commonly known as:  SYNTHROID, LEVOTHROID Take 50 mcg by mouth daily before breakfast.   metFORMIN 500 MG tablet Commonly known as:  GLUCOPHAGE Take 500 mg by mouth daily with breakfast.   NOVOLOG 100 UNIT/ML injection Generic drug:  insulin aspart Inject 8 Units into the skin. After meals       Review of Systems  Review of Systems  Constitutional: Negative for activity change, appetite change, chills, diaphoresis, fatigue and fever.  HENT: Negative for mouth sores, postnasal drip, rhinorrhea, sinus pain and sore throat.   Respiratory: Negative for apnea, cough, chest tightness, shortness of breath and wheezing.   Cardiovascular: Negative for chest pain, palpitations and leg swelling.  Gastrointestinal: Negative for abdominal distention, abdominal pain, constipation, diarrhea, nausea and vomiting.  Genitourinary: Negative for dysuria and frequency.  Musculoskeletal: Negative for arthralgias, joint swelling and myalgias.  Skin: Negative for rash.  Neurological: Negative for dizziness, syncope, weakness, light-headedness and numbness.  Psychiatric/Behavioral: Negative for behavioral problems, confusion and sleep disturbance.     Vitals:   10/16/16 1128  BP: (!) 147/72  Pulse: 66  Resp: 20  Temp: 98.8 F (37.1 C)  TempSrc: Oral   There is no height or weight on file to calculate BMI. Physical Exam  Constitutional: She appears well-developed and well-nourished.  HENT:  Head: Normocephalic.  Mouth/Throat: Oropharynx is clear and moist.  Eyes: Pupils are equal, round,  and reactive to light.  Neck: Neck supple.  Cardiovascular: Normal rate, regular rhythm and normal heart sounds.   No murmur heard. Pulmonary/Chest: Effort normal and breath sounds normal. No respiratory distress. She has no wheezes. She has no rales.  Abdominal: Soft. Bowel sounds are normal. She exhibits no distension. There is no tenderness. There is no rebound.  Musculoskeletal: She exhibits no edema.  Neurological: She is alert.  Patient is oriented to time and place. She does remember few things in details and some things she just makes up or try to change topic. She is very upset if ask detail question. Keep saying how does this effect her discharge. She did not have any Focal deficits.  Skin: Skin is warm and dry.  Psychiatric: Her mood appears anxious. Her affect is angry.    Labs reviewed: Basic Metabolic Panel:  Recent Labs  16/10/96 0256 10/06/16 0600 10/07/16 0411 10/09/16 0454  NA 135  --  135 136  K 2.8*  --  4.4 3.5  CL 96*  --  100* 100*  CO2 29  --  25 27  GLUCOSE 114*  --  226* 191*  BUN 15  --  20 21*  CREATININE 0.66  --  0.62 0.57  CALCIUM 9.2  --  9.2 9.2  MG  --  1.7  --   --    Liver Function Tests:  Recent Labs  10/05/16 1833  AST 23  ALT 11*  ALKPHOS 76  BILITOT 0.8  PROT 6.9  ALBUMIN 3.7   No results for input(s): LIPASE, AMYLASE in the last 8760 hours. No results for input(s): AMMONIA in the last 8760 hours. CBC:  Recent Labs  02/29/16 1338 08/12/16 1319 10/05/16 1833 10/06/16 0256 10/07/16 0411 10/09/16 0611  WBC 7.3 8.6 11.3* 7.9 7.5 13.4*  NEUTROABS 5.4 6.1 8.7*  --   --   --   HGB 12.3 12.7 11.8* 11.0* 11.8* 13.4  HCT 37.7 38.9 36.3 33.5* 36.9 40.6  MCV 87.7 87.8 86.6 87.5 87.9 87.3  PLT 336 319 281 258 287 302   Cardiac Enzymes: No results for input(s): CKTOTAL, CKMB, CKMBINDEX, TROPONINI in the last 8760 hours. BNP: Invalid input(s): POCBNP Lab Results  Component Value Date   HGBA1C 6.8 (H) 10/05/2016   Lab  Results  Component Value Date   TSH 0.493 10/05/2016   No results found for: VITAMINB12 No results found for: FOLATE No results found for: IRON, TIBC, FERRITIN  Imaging and Procedures obtained prior to SNF admission: Dg Chest 2 View  Result Date: 10/05/2016 CLINICAL DATA:  Found in hallway at apartment complex, confusion, history hypertension EXAM: CHEST  2 VIEW COMPARISON:  02/29/2016 FINDINGS: Normal heart size, mediastinal contours, and pulmonary vascularity. Atherosclerotic calcification aorta. Lungs hyperinflated but clear. No infiltrate, pleural effusion or pneumothorax. Bones demineralized. IMPRESSION: Mild hyperinflation without infiltrate. Aortic atherosclerosis. Electronically Signed   By: Ulyses Southward M.D.   On: 10/05/2016 20:30   Ct Head Wo Contrast  Result Date: 10/05/2016 CLINICAL DATA:  Possible fall.  Confusion. EXAM: CT HEAD WITHOUT CONTRAST TECHNIQUE: Contiguous axial images were obtained from the base of the skull through the vertex without intravenous contrast. COMPARISON:  None. FINDINGS: Brain: There are moderately large areas of confluent, marked white matter hypoattenuation suggesting vasogenic edema in both frontal lobes and right temporal lobe with milder edema in the right parietal lobe. No definite mass lesions are identified on this unenhanced study. No definite acute large territory vascular infarct, acute intracranial hemorrhage, midline shift, or extra-axial fluid collection is identified. Additional periventricular white matter hypodensities may reflect chronic small vessel ischemia. Ventricles and sulci are normal in size for age. Vascular: Calcified atherosclerosis at the skullbase. No hyperdense vessel. Skull: No fracture or focal osseous lesion. Sinuses/Orbits: Small left maxillary sinus mucous retention cyst. Clear mastoid air cells. Unremarkable orbits. Other: None. IMPRESSION: Regions of extensive vasogenic edema in both cerebral hemispheres. Contrast-enhanced  brain MRI is recommended to evaluate for underlying neoplasm or infection. No midline shift or acute intracranial hemorrhage. Electronically Signed   By: Sebastian Ache M.D.   On: 10/05/2016 18:43   Mr Laqueta Jean AV Contrast  Addendum Date: 10/06/2016   ADDENDUM REPORT: 10/06/2016 07:58 ADDENDUM: These results were called by telephone at the time of interpretation on 10/06/2016 at 7:58 am to Dr. Benjamine Mola , who verbally acknowledged these results. Electronically Signed   By: Marlan Palau M.D.   On:  10/06/2016 07:58   Result Date: 10/06/2016 CLINICAL DATA:  Vasogenic brain edema.  Confusion EXAM: MRI HEAD WITHOUT AND WITH CONTRAST TECHNIQUE: Multiplanar, multiecho pulse sequences of the brain and surrounding structures were obtained without and with intravenous contrast. CONTRAST:  11mL MULTIHANCE GADOBENATE DIMEGLUMINE 529 MG/ML IV SOLN COMPARISON:  CT head 10/05/2016 FINDINGS: Brain: Prominent vasogenic edema is present in both cerebral hemispheres involving the frontal lobes bilaterally and the right temporoparietal lobe. There is sparing of the cortex. Lab no, no underlying enhancing mass lesion is identified to explain the vasogenic edema. There is mild effacement of the sulci due to edema, no shift of the midline structures. There is a dominant finding of extensive microhemorrhage throughout the cerebral cortex bilaterally, with relative sparing of the occipital lobes and posterior fossa. Numerous areas of microhemorrhage are present in the cortex and gray-white junction diffusely. Postcontrast imaging reveals mild leptomeningeal enhancement bilaterally. No enhancing mass lesion. No areas of restricted diffusion.  Negative for acute infarct. Vascular: Normal arterial flow voids. Skull and upper cervical spine: Negative Sinuses/Orbits: Negative Other: None IMPRESSION: Extensive vasogenic edema in both cerebral hemispheres. Extensive microhemorrhage throughout the cerebral cortex bilaterally with relative sparing of  the occipital lobes. Mild leptomeningeal enhancement postcontrast infusion. No enhancing mass lesion. Given the constellation of findings and history of indolent progressive confusion, cerebral amyloid with inflammation is considered the most likely diagnosis. Less likely considerations include encephalitis. Metastatic disease not considered likely. Electronically Signed: By: Marlan Palau M.D. On: 10/06/2016 07:53    Assessment/Plan Cerebral amyloid angiopathy  Patient was treated with Solumedrol and she has done well Cognitively. She needs to follow Neurologist as out patient to see further treatment.  Essential hypertension BP controlled on Amlodipine. Mildly high this morning as patient has been little upset.  Hypothyroidism TSH was Normal in Hospital.  type 2 diabetes mellitus  She is on Metformin.  Her sugars now that she is off Steroids are less then 200.  Confusion Patent continues to have judgement lapse and confusion. Though on mini mental she seems oriented. I had detailed d/w Child psychotherapist and Speech therapist. Patient does not have any POA. She has sister who is in [place] in assisted facility and is trying to get help for  Patient. Patient is adamant that she is capable to go home and take care of herself. She lived in apartment by herself. I have discussed with Speech therapist who agreed that she has lapse of Judgement and would not be safe to go home. Will order for detailed Cognitive eval. She would need more supervision.  Labs reviewed  Her BUN and Creat are normal. Her white count was normal and HGB was WNL.   Family/ staff Communication:   Labs/tests ordered: Total time spent in this patient care encounter was 45_ minutes; greater than 50% of the visit spent counseling patient and coordinating care with Nurse and Therapist for problems addressed at this encounter.

## 2016-11-05 LAB — FUNGUS CULTURE WITH STAIN

## 2016-11-05 LAB — FUNGUS CULTURE RESULT

## 2016-11-05 LAB — FUNGAL ORGANISM REFLEX

## 2016-11-13 ENCOUNTER — Non-Acute Institutional Stay (SKILLED_NURSING_FACILITY): Payer: Medicare Other | Admitting: Adult Health

## 2016-11-13 ENCOUNTER — Encounter: Payer: Self-pay | Admitting: Adult Health

## 2016-11-13 DIAGNOSIS — I10 Essential (primary) hypertension: Secondary | ICD-10-CM | POA: Diagnosis not present

## 2016-11-13 DIAGNOSIS — E038 Other specified hypothyroidism: Secondary | ICD-10-CM

## 2016-11-13 DIAGNOSIS — E119 Type 2 diabetes mellitus without complications: Secondary | ICD-10-CM

## 2016-11-13 DIAGNOSIS — E854 Organ-limited amyloidosis: Secondary | ICD-10-CM | POA: Diagnosis not present

## 2016-11-13 DIAGNOSIS — G94 Other disorders of brain in diseases classified elsewhere: Secondary | ICD-10-CM | POA: Diagnosis not present

## 2016-11-13 NOTE — Progress Notes (Signed)
Location:   [facility] Room Number: 124 B Place of Service:  SNF (31)   CODE STATUS: DNR  Allergies  Allergen Reactions  . Zestril [Lisinopril] Shortness Of Breath  . Norvasc [Amlodipine Besylate] Itching    Patient tolerates amlodipine and takes at home.   . Tenormin [Atenolol] Other (See Comments)    fatigue    Chief Complaint  Patient presents with  . Medical Management of Chronic Issues    1 month follow up    HPI:  She is a short term resident of this facility being seen for the management of her chronic illnesses. She tells me that she is feeling good and has no complaints. She is up and about independently. There are no nursing concerns at this time.    Past Medical History:  Diagnosis Date  . Cardiac murmur   . Dizziness   . Hyperlipidemia   . Hypertension   . Pre-diabetes   . Syncope and collapse   . Thyroid disease   . Vitamin D deficiency     Past Surgical History:  Procedure Laterality Date  . TONSILLECTOMY      Social History   Social History  . Marital status: Single    Spouse name: [name]  . Number of children: [name]  . Years of education: [name]   Occupational History  . Not on file.   Social History Main Topics  . Smoking status: Never Smoker  . Smokeless tobacco: Never Used  . Alcohol use No  . Drug use: No  . Sexual activity: Not on file   Other Topics Concern  . Not on file   Social History Narrative  . No narrative on file   Family History  Problem Relation Age of Onset  . Stroke Mother   . Alzheimer's disease Father       VITAL SIGNS BP (!) 146/84   Pulse 68   Temp 98.4 F (36.9 C)   Resp 18   Ht 5\' 8"  (1.727 m)   Wt 123 lb (55.8 kg)   SpO2 98%   BMI 18.70 kg/m   Patient's Medications  New Prescriptions   No medications on file  Previous Medications   ACETAMINOPHEN (TYLENOL) 325 MG TABLET    Take 650 mg by mouth every 6 (six) hours as needed for mild pain.   AMLODIPINE (NORVASC) 10 MG TABLET     Take 10 mg by mouth daily.   DICLOFENAC SODIUM (VOLTAREN) 1 % GEL    Apply 2 g topically 3 (three) times daily as needed.   INSULIN ASPART (NOVOLOG) 100 UNIT/ML INJECTION    Inject 8 Units into the skin. After meals   LEVOTHYROXINE (SYNTHROID, LEVOTHROID) 50 MCG TABLET    Take 50 mcg by mouth daily before breakfast.   METFORMIN (GLUCOPHAGE) 500 MG TABLET    Take 500 mg by mouth daily with breakfast.  Modified Medications   No medications on file  Discontinued Medications   CEPHALEXIN 500 MG TABLET    Take 500 mg by mouth every 12 (twelve) hours.     SIGNIFICANT DIAGNOSTIC EXAMS  10-05-16: ct of head: Regions of extensive vasogenic edema in both cerebral hemispheres. Contrast-enhanced brain MRI is recommended to evaluate for underlying neoplasm or infection. No midline shift or acute intracranial hemorrhage.   10-05-16: chest x-ray: Mild hyperinflation without infiltrate. Aortic atherosclerosis.  10-06-16: mri of brain: Extensive vasogenic edema in both cerebral hemispheres. Extensive microhemorrhage throughout the cerebral cortex bilaterally with relative sparing of the  occipital lobes. Mild leptomeningeal enhancement postcontrast infusion. No enhancing mass lesion.   Given the constellation of findings and history of indolent progressive confusion, cerebral amyloid with inflammation is considered the most likely diagnosis. Less likely considerations include encephalitis. Metastatic disease not considered likely.    LABS REVIEWED:   10-05-16: wbc 11.3; hgb 11.8; hct 36.3; mcv 86.6; plt 281; glucose 137; bun 29; creat 0.87; k+ 3.6; na++ 134; ;liver normal albumin 3.7; tsh 0.493; hgb a1c 6.8; urine culture: e-coli: keflex 10-06-16: wbc 7.9; hgb 11.0; hct 33.5 ;mcv 87.5; plt 258; glucose 114; bun 15; creat 0.66; k+ 2.8; na++ 135; vit D 42.4 10-07-16: wbc 7.5; hgb 11.8; hct 36.9; mcv 87.9; plt 287; glucose 226; bun 20; creat 0.62; k+ 4.4; na++ 135 10-09-16: wbc 13.4; hgb 13.4; hct 40.6; mcv 87.3; plt  302; glucose 191; bun 21; creat 0.57; k+ 3.5; na++ 136  10-16-16: wbc 8.0 hgb 12.0; hct 36.9; mcv 91.2 ;plt 161; glucose 126; bun 19; creat 0.43; k+ 4.2; na++ 139;     Review of Systems  Constitutional: Negative for malaise/fatigue.  Respiratory: Negative for cough and shortness of breath.   Cardiovascular: Negative for chest pain, palpitations and leg swelling.  Gastrointestinal: Negative for abdominal pain, constipation and heartburn.  Musculoskeletal: Negative for back pain, joint pain and myalgias.  Skin: Negative.   Neurological: Negative for dizziness.  Psychiatric/Behavioral: The patient is not nervous/anxious.      Physical Exam  Constitutional: No distress.  Eyes: Conjunctivae are normal.  Neck: Neck supple. No JVD present. No thyromegaly present.  Cardiovascular: Normal rate, regular rhythm and intact distal pulses.   Murmur heard. Respiratory: Effort normal and breath sounds normal. No respiratory distress. She has no wheezes.  GI: Soft. Bowel sounds are normal. She exhibits no distension. There is no tenderness.  Musculoskeletal: She exhibits no edema.  Able to move all extremities   Lymphadenopathy:    She has no cervical adenopathy.  Neurological: She is alert.  Skin: Skin is warm and dry. She is not diaphoretic.  Psychiatric: She has a normal mood and affect.     ASSESSMENT/ PLAN:  1. Hypertension: b/p: 148/84  will continue norvasc 10 mg daily   2. Hypothyroidism: tsh is 0.493; will continue synthroid 50 mcg daily   3. Diabetes: hgb a1c 6.8; will continue novolog 8 units after meals; will continue metformin 500 mg daily   4. Cerebral amyloid angiopathy; vasogenic brain edema: did receive  solumedrol while in the hospital;  Awaiting neurologic follow up     Synthia Innocent NP [facility]  Contact [phone] Monday through Friday 8am- 5pm  After hours call [phone]

## 2016-12-11 ENCOUNTER — Non-Acute Institutional Stay (SKILLED_NURSING_FACILITY): Payer: Medicare Other | Admitting: Adult Health

## 2016-12-11 ENCOUNTER — Encounter: Payer: Self-pay | Admitting: Adult Health

## 2016-12-11 DIAGNOSIS — E119 Type 2 diabetes mellitus without complications: Secondary | ICD-10-CM | POA: Diagnosis not present

## 2016-12-11 DIAGNOSIS — E854 Organ-limited amyloidosis: Secondary | ICD-10-CM | POA: Diagnosis not present

## 2016-12-11 DIAGNOSIS — I680 Cerebral amyloid angiopathy: Secondary | ICD-10-CM | POA: Diagnosis not present

## 2016-12-11 DIAGNOSIS — G936 Cerebral edema: Secondary | ICD-10-CM

## 2016-12-11 DIAGNOSIS — E038 Other specified hypothyroidism: Secondary | ICD-10-CM

## 2016-12-11 DIAGNOSIS — I10 Essential (primary) hypertension: Secondary | ICD-10-CM | POA: Diagnosis not present

## 2016-12-11 NOTE — Progress Notes (Signed)
Location:   [facility] Room Number: 124 B Place of Service:  SNF (31)   CODE STATUS: DNR  Allergies  Allergen Reactions  . Zestril [Lisinopril] Shortness Of Breath  . Norvasc [Amlodipine Besylate] Itching    Patient tolerates amlodipine and takes at home.   . Tenormin [Atenolol] Other (See Comments)    fatigue    Chief Complaint  Patient presents with  . Medical Management of Chronic Issues    1 month follow up    HPI:  She is a 83 year old resident of this facility being seen for the management of her chronic illnesses. Overall she is doing well. She is up an about. Her cbg's are pretty good. There are no nursing concerns at this time.   Past Medical History:  Diagnosis Date  . Cardiac murmur   . Dizziness   . Hyperlipidemia   . Hypertension   . Pre-diabetes   . Syncope and collapse   . Thyroid disease   . Vitamin D deficiency     Past Surgical History:  Procedure Laterality Date  . TONSILLECTOMY      Social History   Social History  . Marital status: Single    Spouse name: [name]  . Number of children: [name]  . Years of education: [name]   Occupational History  . Not on file.   Social History Main Topics  . Smoking status: Never Smoker  . Smokeless tobacco: Never Used  . Alcohol use No  . Drug use: No  . Sexual activity: Not on file   Other Topics Concern  . Not on file   Social History Narrative  . No narrative on file   Family History  Problem Relation Age of Onset  . Stroke Mother   . Alzheimer's disease Father       VITAL SIGNS BP 126/80   Pulse 81   Temp 98.1 F (36.7 C)   Resp 18   Ht 5\' 8"  (1.727 m)   Wt 123 lb (55.8 kg)   SpO2 94%   BMI 18.70 kg/m   Patient's Medications  New Prescriptions   No medications on file  Previous Medications   ACETAMINOPHEN (TYLENOL) 325 MG TABLET    Take 650 mg by mouth every 6 (six) hours as needed for mild pain.   AMLODIPINE (NORVASC) 10 MG TABLET    Take 10 mg by mouth daily.   INSULIN ASPART (NOVOLOG) 100 UNIT/ML INJECTION    Inject 8 Units into the skin. After meals   LEVOTHYROXINE (SYNTHROID, LEVOTHROID) 50 MCG TABLET    Take 50 mcg by mouth daily before breakfast.   METFORMIN (GLUCOPHAGE) 500 MG TABLET    Take 500 mg by mouth daily with breakfast.  Modified Medications   No medications on file  Discontinued Medications   DICLOFENAC SODIUM (VOLTAREN) 1 % GEL    Apply 2 g topically 3 (three) times daily as needed.     SIGNIFICANT DIAGNOSTIC EXAMS  10-05-16: ct of head: Regions of extensive vasogenic edema in both cerebral hemispheres. Contrast-enhanced brain MRI is recommended to evaluate for underlying neoplasm or infection. No midline shift or acute intracranial hemorrhage.   10-05-16: chest x-ray: Mild hyperinflation without infiltrate. Aortic atherosclerosis.  10-06-16: mri of brain: Extensive vasogenic edema in both cerebral hemispheres. Extensive microhemorrhage throughout the cerebral cortex bilaterally with relative sparing of the occipital lobes. Mild leptomeningeal enhancement postcontrast infusion. No enhancing mass lesion.   Given the constellation of findings and history of indolent progressive  confusion, cerebral amyloid with inflammation is considered the most likely diagnosis. Less likely considerations include encephalitis. Metastatic disease not considered likely.    LABS REVIEWED:   10-05-16: wbc 11.3; hgb 11.8; hct 36.3; mcv 86.6; plt 281; glucose 137; bun 29; creat 0.87; k+ 3.6; na++ 134; ;liver normal albumin 3.7; tsh 0.493; hgb a1c 6.8; urine culture: e-coli: keflex 10-06-16: wbc 7.9; hgb 11.0; hct 33.5 ;mcv 87.5; plt 258; glucose 114; bun 15; creat 0.66; k+ 2.8; na++ 135; vit D 42.4 10-07-16: wbc 7.5; hgb 11.8; hct 36.9; mcv 87.9; plt 287; glucose 226; bun 20; creat 0.62; k+ 4.4; na++ 135 10-09-16: wbc 13.4; hgb 13.4; hct 40.6; mcv 87.3; plt 302; glucose 191; bun 21; creat 0.57; k+ 3.5; na++ 136  10-16-16: wbc 8.0 hgb 12.0; hct 36.9; mcv 91.2 ;plt  161; glucose 126; bun 19; creat 0.43; k+ 4.2; na++ 139;     Review of Systems  Constitutional: Negative for malaise/fatigue.  Respiratory: Negative for cough and shortness of breath.   Cardiovascular: Negative for chest pain, palpitations and leg swelling.  Gastrointestinal: Negative for abdominal pain, constipation and heartburn.  Musculoskeletal: Negative for back pain, joint pain and myalgias.  Skin: Negative.   Neurological: Negative for dizziness.  Psychiatric/Behavioral: The patient is not nervous/anxious.      Physical Exam  Constitutional: No distress.  Eyes: Conjunctivae are normal.  Neck: Neck supple. No JVD present. No thyromegaly present.  Cardiovascular: Normal rate, regular rhythm and intact distal pulses.   Murmur heard. Respiratory: Effort normal and breath sounds normal. No respiratory distress. She has no wheezes.  GI: Soft. Bowel sounds are normal. She exhibits no distension. There is no tenderness.  Musculoskeletal: She exhibits no edema.  Able to move all extremities   Lymphadenopathy:    She has no cervical adenopathy.  Neurological: She is alert.  Skin: Skin is warm and dry. She is not diaphoretic.  Psychiatric: She has a normal mood and affect.     ASSESSMENT/ PLAN:  1. Hypertension: b/p: 126/80  will continue norvasc 10 mg daily   2. Hypothyroidism: tsh is 0.493; will continue synthroid 50 mcg daily   3. Diabetes: hgb a1c 6.8; will continue novolog 8 units after meals; will continue metformin 500 mg daily   4. Cerebral amyloid angiopathy; vasogenic brain edema: did receive  solumedrol while in the hospital;  Awaiting neurologic follow up    MD is aware of resident's narcotic use and is in agreement with current plan of care. We will attempt to wean resident as apropriate     Christina Innocenteborah Green NP [facility]  Contact [phone] Monday through Friday 8am- 5pm  After hours call [phone]

## 2017-01-01 DIAGNOSIS — I10 Essential (primary) hypertension: Secondary | ICD-10-CM | POA: Insufficient documentation

## 2017-01-09 ENCOUNTER — Non-Acute Institutional Stay (SKILLED_NURSING_FACILITY): Payer: Medicare Other | Admitting: Internal Medicine

## 2017-01-09 ENCOUNTER — Encounter: Payer: Self-pay | Admitting: Internal Medicine

## 2017-01-09 DIAGNOSIS — E11649 Type 2 diabetes mellitus with hypoglycemia without coma: Secondary | ICD-10-CM | POA: Diagnosis not present

## 2017-01-09 DIAGNOSIS — E854 Organ-limited amyloidosis: Secondary | ICD-10-CM | POA: Diagnosis not present

## 2017-01-09 DIAGNOSIS — G936 Cerebral edema: Secondary | ICD-10-CM

## 2017-01-09 DIAGNOSIS — I680 Cerebral amyloid angiopathy: Secondary | ICD-10-CM

## 2017-01-09 DIAGNOSIS — I10 Essential (primary) hypertension: Secondary | ICD-10-CM

## 2017-01-09 DIAGNOSIS — E034 Atrophy of thyroid (acquired): Secondary | ICD-10-CM

## 2017-01-09 DIAGNOSIS — Z794 Long term (current) use of insulin: Secondary | ICD-10-CM | POA: Diagnosis not present

## 2017-01-09 NOTE — Progress Notes (Signed)
DATE:  January 09, 2017  Location:   [facility] Room Number: 120 A Place of Service: SNF (31)   Extended Emergency Contact Information Primary Emergency Contact: Coralie Carpen, [place] Phone: [phone] Mobile Phone: [phone] Relation: Sister  Advanced Directive information Does Patient Have a Medical Advance Directive?: Yes, Type of Advance Directive: Out of facility DNR (pink MOST or yellow form), Pre-existing out of facility DNR order (yellow form or pink MOST form): Pink MOST form placed in chart (order not valid for inpatient use), Does patient want to make changes to medical advance directive?: No - Patient declined  Chief Complaint  Patient presents with  . Medical Management of Chronic Issues    1 month follow up    HPI:  83 yo female seen today for f/u. She c/o food not tasty. BM ok and feels stronger. She reports x 2 low BS reactions (tremors) on yesterday and day before yesterday (51 and 31 respectively) that occurred right before evening meal and resolved with eating peanut butter cracker. No other concerns. Albumin 3.7.  Hypertension - stable on norvasc 10 mg daily   Hypothyroidism - stable on synthroid 50 mcg daily. TSH 0.493   DM - controlled. A1c 6.8%. Takes novolog 8 units after meals; metformin 500 mg daily. CBG 140-260s. Occasional low BS reactions in between noon meal and evening meal.  Cerebral amyloid angiopathy/ vasogenic brain edema - s/p IV solumedrol; followed by neurology   Past Medical History:  Diagnosis Date  . Cardiac murmur   . Dizziness   . Hyperlipidemia   . Hypertension   . Pre-diabetes   . Syncope and collapse   . Thyroid disease   . Vitamin D deficiency     Past Surgical History:  Procedure Laterality Date  . TONSILLECTOMY      Patient Care Team: Rankins, Bill Salinas, MD as PCP - General (Family Medicine) Rankins, Bill Salinas, MD as Consulting Physician [facility]  Medicine)  Social History   Social History  . Marital status: Single    Spouse name: [name]  . Number of children: [name]  . Years of education: [name]   Occupational History  . Not on file.   Social History Main Topics  . Smoking status: Never Smoker  . Smokeless tobacco: Never Used  . Alcohol use No  . Drug use: No  . Sexual activity: Not on file   Other Topics Concern  . Not on file   Social History Narrative  . No narrative on file     reports that she has never smoked. She has never used smokeless tobacco. She reports that she does not drink alcohol or use drugs.  Family History  Problem Relation Age of Onset  . Stroke Mother   . Alzheimer's disease Father    Family Status  Relation Status  . Mother Deceased  . Father Deceased    Immunization History  Administered Date(s) Administered  . PPD Test 10/30/2016    Allergies  Allergen Reactions  . Zestril [Lisinopril] Shortness Of Breath  . Norvasc [Amlodipine Besylate] Itching    Patient tolerates amlodipine and takes at home.   . Tenormin [Atenolol] Other (See Comments)    fatigue    Medications: Patient's Medications  New Prescriptions   No medications on file  Previous Medications   ACETAMINOPHEN (TYLENOL) 325 MG TABLET    Take 650 mg by mouth every 6 (six) hours  as needed for mild pain.   AMLODIPINE (NORVASC) 10 MG TABLET    Take 10 mg by mouth daily.   INSULIN ASPART (NOVOLOG) 100 UNIT/ML INJECTION    Inject 8 Units into the skin. After meals   LEVOTHYROXINE (SYNTHROID, LEVOTHROID) 50 MCG TABLET    Take 50 mcg by mouth daily before breakfast.   METFORMIN (GLUCOPHAGE) 500 MG TABLET    Take 500 mg by mouth daily with breakfast.   POLYVINYL ALCOHOL (LIQUIFILM TEARS) 1.4 % OPHTHALMIC SOLUTION    Place 1 drop into both eyes 3 (three) times daily.   UNABLE TO FIND    HSG CCD 2 Gram Sodium Diet, Regular Texture, Regular Consistency  Modified Medications   No medications on file  Discontinued Medications   No  medications on file    Review of Systems  Neurological: Positive for tremors.  All other systems reviewed and are negative.   Vitals:   01/09/17 1100  BP: (!) 150/82  Pulse: 79  Weight: 134 lb 9.6 oz (61.1 kg)  Height: 5' 8"  (1.727 m)   Body mass index is 20.47 kg/m.  Physical Exam  Constitutional: She is oriented to person, place, and time. She appears well-developed and well-nourished.  Sitting on edge of bed in NAD, frail appearing  HENT:  Mouth/Throat: Oropharynx is clear and moist. No oropharyngeal exudate.  MMM; no oral thrush  Eyes: Pupils are equal, round, and reactive to light. No scleral icterus.  Neck: Neck supple. Carotid bruit is not present. No tracheal deviation present. No thyromegaly present.  Cardiovascular: Normal rate, regular rhythm and intact distal pulses.  Exam reveals no gallop and no friction rub.   Murmur (1/6 SEM) heard. No LE edema b/l. no calf TTP.   Pulmonary/Chest: Effort normal and breath sounds normal. No stridor. No respiratory distress. She has no wheezes. She has no rales.  Abdominal: Soft. Normal appearance and bowel sounds are normal. She exhibits no distension and no mass. There is no hepatomegaly. There is no tenderness. There is no rigidity, no rebound and no guarding. No hernia.  Lymphadenopathy:    She has no cervical adenopathy.  Neurological: She is alert and oriented to person, place, and time. She has normal reflexes.  Skin: Skin is warm and dry. No rash noted.  Psychiatric: She has a normal mood and affect. Her behavior is normal. Judgment and thought content normal.     Labs reviewed: Abstract on 10/18/2016  Component Date Value Ref Range Status  . Hemoglobin 10/16/2016 12.0  12.0 - 16.0 g/dL Final  . HCT 10/16/2016 37  36 - 46 % Final  . Neutrophils Absolute 10/16/2016 5  /L Final  . Platelets 10/16/2016 161  150 - 399 K/L Final  . WBC 10/16/2016 8.0  10^3/mL Final  . Glucose 10/16/2016 126  mg/dL Final  . BUN  10/16/2016 19  4 - 21 mg/dL Final  . Creatinine 10/16/2016 0.4* 0.5 - 1.1 mg/dL Final  . Potassium 10/16/2016 4.2  3.4 - 5.3 mmol/L Final  . Sodium 10/16/2016 139  137 - 147 mmol/L Final  Admission on 10/05/2016, Discharged on 10/11/2016  Component Date Value Ref Range Status  . WBC 10/05/2016 11.3* 4.0 - 10.5 K/uL Final  . RBC 10/05/2016 4.19  3.87 - 5.11 MIL/uL Final  . Hemoglobin 10/05/2016 11.8* 12.0 - 15.0 g/dL Final  . HCT 10/05/2016 36.3  36.0 - 46.0 % Final  . MCV 10/05/2016 86.6  78.0 - 100.0 fL Final  . MCH 10/05/2016 28.2  26.0 - 34.0 pg Final  . MCHC 10/05/2016 32.5  30.0 - 36.0 g/dL Final  . RDW 10/05/2016 14.8  11.5 - 15.5 % Final  . Platelets 10/05/2016 281  150 - 400 K/uL Final  . Neutrophils Relative % 10/05/2016 77  % Final  . Neutro Abs 10/05/2016 8.7* 1.7 - 7.7 K/uL Final  . Lymphocytes Relative 10/05/2016 17  % Final  . Lymphs Abs 10/05/2016 2.0  0.7 - 4.0 K/uL Final  . Monocytes Relative 10/05/2016 6  % Final  . Monocytes Absolute 10/05/2016 0.7  0.1 - 1.0 K/uL Final  . Eosinophils Relative 10/05/2016 0  % Final  . Eosinophils Absolute 10/05/2016 0.0  0.0 - 0.7 K/uL Final  . Basophils Relative 10/05/2016 0  % Final  . Basophils Absolute 10/05/2016 0.0  0.0 - 0.1 K/uL Final  . Sodium 10/05/2016 134* 135 - 145 mmol/L Final  . Potassium 10/05/2016 3.6  3.5 - 5.1 mmol/L Final  . Chloride 10/05/2016 100* 101 - 111 mmol/L Final  . CO2 10/05/2016 23  22 - 32 mmol/L Final  . Glucose, Bld 10/05/2016 137* 65 - 99 mg/dL Final  . BUN 10/05/2016 29* 6 - 20 mg/dL Final  . Creatinine, Ser 10/05/2016 0.87  0.44 - 1.00 mg/dL Final  . Calcium 10/05/2016 9.2  8.9 - 10.3 mg/dL Final  . Total Protein 10/05/2016 6.9  6.5 - 8.1 g/dL Final  . Albumin 10/05/2016 3.7  3.5 - 5.0 g/dL Final  . AST 10/05/2016 23  15 - 41 U/L Final  . ALT 10/05/2016 11* 14 - 54 U/L Final  . Alkaline Phosphatase 10/05/2016 76  38 - 126 U/L Final  . Total Bilirubin 10/05/2016 0.8  0.3 - 1.2 mg/dL Final    . GFR calc non Af Amer 10/05/2016 59* >60 mL/min Final  . GFR calc Af Amer 10/05/2016 >60  >60 mL/min Final   Comment: (NOTE) The eGFR has been calculated using the CKD EPI equation. This calculation has not been validated in all clinical situations. eGFR's persistently <60 mL/min signify possible Chronic Kidney Disease.   . Anion gap 10/05/2016 11  5 - 15 Final  . Lactic Acid, Venous 10/05/2016 1.19  0.5 - 1.9 mmol/L Final  . Color, Urine 10/05/2016 YELLOW  YELLOW Final  . APPearance 10/05/2016 CLOUDY* CLEAR Final  . Specific Gravity, Urine 10/05/2016 1.013  1.005 - 1.030 Final  . pH 10/05/2016 5.0  5.0 - 8.0 Final  . Glucose, UA 10/05/2016 NEGATIVE  NEGATIVE mg/dL Final  . Hgb urine dipstick 10/05/2016 MODERATE* NEGATIVE Final  . Bilirubin Urine 10/05/2016 NEGATIVE  NEGATIVE Final  . Ketones, ur 10/05/2016 20* NEGATIVE mg/dL Final  . Protein, ur 10/05/2016 30* NEGATIVE mg/dL Final  . Nitrite 10/05/2016 NEGATIVE  NEGATIVE Final  . Leukocytes, UA 10/05/2016 LARGE* NEGATIVE Final  . RBC / HPF 10/05/2016 TOO NUMEROUS TO COUNT  0 - 5 RBC/hpf Final  . WBC, UA 10/05/2016 TOO NUMEROUS TO COUNT  0 - 5 WBC/hpf Final  . Bacteria, UA 10/05/2016 MANY* NONE SEEN Final  . Squamous Epithelial / LPF 10/05/2016 6-30* NONE SEEN Final  . Mucous 10/05/2016 PRESENT   Final  . Hyaline Casts, UA 10/05/2016 PRESENT   Final  . Non Squamous Epithelial 10/05/2016 6-30* NONE SEEN Final  . Specimen Description 10/05/2016 URINE, CATHETERIZED   Final  . Special Requests 10/05/2016 NONE   Final  . Culture 10/05/2016 >=100,000 COLONIES/mL ESCHERICHIA COLI*  Final  . Report Status 10/05/2016 10/07/2016 FINAL   Final  .  Organism ID, Bacteria 10/05/2016 ESCHERICHIA COLI*  Final  . Troponin i, poc 10/05/2016 0.02  0.00 - 0.08 ng/mL Final  . Comment 3 10/05/2016          Final   Comment: Due to the release kinetics of cTnI, a negative result within the first hours of the onset of symptoms does not rule  out myocardial infarction with certainty. If myocardial infarction is still suspected, repeat the test at appropriate intervals.   . Lactic Acid, Venous 10/05/2016 0.70  0.5 - 1.9 mmol/L Final  . TSH 10/05/2016 0.493  0.350 - 4.500 uIU/mL Final   Performed by a 3rd Generation assay with a functional sensitivity of <=0.01 uIU/mL.  . Hgb A1c MFr Bld 10/05/2016 6.8* 4.8 - 5.6 % Final   Comment: (NOTE)         Pre-diabetes: 5.7 - 6.4         Diabetes: >6.4         Glycemic control for adults with diabetes: <7.0   . Mean Plasma Glucose 10/05/2016 148  mg/dL Final   Comment: (NOTE) Performed At: [facility] [place], [place] [id number] Lindon Romp MD AT:[id number]   . Sodium 10/06/2016 135  135 - 145 mmol/L Final  . Potassium 10/06/2016 2.8* 3.5 - 5.1 mmol/L Final   DELTA CHECK NOTED  . Chloride 10/06/2016 96* 101 - 111 mmol/L Final  . CO2 10/06/2016 29  22 - 32 mmol/L Final  . Glucose, Bld 10/06/2016 114* 65 - 99 mg/dL Final  . BUN 10/06/2016 15  6 - 20 mg/dL Final  . Creatinine, Ser 10/06/2016 0.66  0.44 - 1.00 mg/dL Final  . Calcium 10/06/2016 9.2  8.9 - 10.3 mg/dL Final  . GFR calc non Af Amer 10/06/2016 >60  >60 mL/min Final  . GFR calc Af Amer 10/06/2016 >60  >60 mL/min Final   Comment: (NOTE) The eGFR has been calculated using the CKD EPI equation. This calculation has not been validated in all clinical situations. eGFR's persistently <60 mL/min signify possible Chronic Kidney Disease.   . Anion gap 10/06/2016 10  5 - 15 Final  . WBC 10/06/2016 7.9  4.0 - 10.5 K/uL Final  . RBC 10/06/2016 3.83* 3.87 - 5.11 MIL/uL Final  . Hemoglobin 10/06/2016 11.0* 12.0 - 15.0 g/dL Final  . HCT 10/06/2016 33.5* 36.0 - 46.0 % Final  . MCV 10/06/2016 87.5  78.0 - 100.0 fL Final  . MCH 10/06/2016 28.7  26.0 - 34.0 pg Final  . MCHC 10/06/2016 32.8  30.0 - 36.0 g/dL Final  . RDW 10/06/2016 14.7  11.5 - 15.5 % Final  . Platelets 10/06/2016 258  150 - 400 K/uL  Final  . Vit D, 25-Hydroxy 10/06/2016 42.4  30.0 - 100.0 ng/mL Final   Comment: (NOTE) Vitamin D deficiency has been defined by the Institute of Medicine and an Endocrine Society practice guideline as a level of serum 25-OH vitamin D less than 20 ng/mL (1,2). The Endocrine Society went on to further define vitamin D insufficiency as a level between 21 and 29 ng/mL (2). 1. IOM (Institute of Medicine). 2010. Dietary reference   intakes for calcium and D. [place]: The   [employer]. 2. Holick MF, Binkley [place], Bischoff-Ferrari HA, et al.   Evaluation, treatment, and prevention of vitamin D   deficiency: an Endocrine Society clinical practice   guideline. JCEM. 2011 Jul; 96(7):1911-30. Performed At: [facility] [address] [place], [place] [id number] Darrel Hoover  F MD WU:[id number]   . Glucose-Capillary 10/05/2016 160* 65 - 99 mg/dL Final  . Comment 1 10/05/2016 Notify RN   Final  . Comment 2 10/05/2016 Document in Chart   Final  . Magnesium 10/06/2016 1.7  1.7 - 2.4 mg/dL Final  . Glucose-Capillary 10/06/2016 116* 65 - 99 mg/dL Final  . Glucose-Capillary 10/06/2016 246* 65 - 99 mg/dL Final  . WBC 10/07/2016 7.5  4.0 - 10.5 K/uL Final  . RBC 10/07/2016 4.20  3.87 - 5.11 MIL/uL Final  . Hemoglobin 10/07/2016 11.8* 12.0 - 15.0 g/dL Final  . HCT 10/07/2016 36.9  36.0 - 46.0 % Final  . MCV 10/07/2016 87.9  78.0 - 100.0 fL Final  . MCH 10/07/2016 28.1  26.0 - 34.0 pg Final  . MCHC 10/07/2016 32.0  30.0 - 36.0 g/dL Final  . RDW 10/07/2016 14.6  11.5 - 15.5 % Final  . Platelets 10/07/2016 287  150 - 400 K/uL Final  . Sodium 10/07/2016 135  135 - 145 mmol/L Final  . Potassium 10/07/2016 4.4  3.5 - 5.1 mmol/L Final   DELTA CHECK NOTED  . Chloride 10/07/2016 100* 101 - 111 mmol/L Final  . CO2 10/07/2016 25  22 - 32 mmol/L Final  . Glucose, Bld 10/07/2016 226* 65 - 99 mg/dL Final  . BUN 10/07/2016 20  6 - 20 mg/dL Final  . Creatinine, Ser 10/07/2016 0.62  0.44  - 1.00 mg/dL Final  . Calcium 10/07/2016 9.2  8.9 - 10.3 mg/dL Final  . GFR calc non Af Amer 10/07/2016 >60  >60 mL/min Final  . GFR calc Af Amer 10/07/2016 >60  >60 mL/min Final   Comment: (NOTE) The eGFR has been calculated using the CKD EPI equation. This calculation has not been validated in all clinical situations. eGFR's persistently <60 mL/min signify possible Chronic Kidney Disease.   . Anion gap 10/07/2016 10  5 - 15 Final  . Glucose-Capillary 10/06/2016 151* 65 - 99 mg/dL Final  . Glucose-Capillary 10/06/2016 233* 65 - 99 mg/dL Final  . Comment 1 10/06/2016 Notify RN   Final  . Comment 2 10/06/2016 Document in Chart   Final  . Glucose-Capillary 10/07/2016 196* 65 - 99 mg/dL Final  . Troponin i, poc 10/05/2016 0.01  0.00 - 0.08 ng/mL Final  . Comment 3 10/05/2016          Final   Comment: Due to the release kinetics of cTnI, a negative result within the first hours of the onset of symptoms does not rule out myocardial infarction with certainty. If myocardial infarction is still suspected, repeat the test at appropriate intervals.   . Glucose-Capillary 10/07/2016 285* 65 - 99 mg/dL Final  . Glucose, CSF 10/07/2016 114* 40 - 70 mg/dL Final  . Total  Protein, CSF 10/07/2016 81* 15 - 45 mg/dL Final  . VDRL Quant, CSF 10/07/2016 Non Reactive  Non Rea:<1:1 Final   Comment: (NOTE) Performed At: [facility] [place], [place] [id number] Lindon Romp MD KV:[id number]   . Tube # 10/07/2016 3   Final  . Color, CSF 10/07/2016 COLORLESS  COLORLESS Final  . Appearance, CSF 10/07/2016 CLEAR  CLEAR Final  . Supernatant 10/07/2016 NOT INDICATED   Final  . RBC Count, CSF 10/07/2016 3* 0 /cu mm Final  . WBC, CSF 10/07/2016 2  0 - 5 /cu mm Final  . Other Cells, CSF 10/07/2016 TOO FEW TO COUNT, SMEAR AVAILABLE FOR REVIEW   Final   RARE LYMPH AND MONO  .  Fungus Stain 10/07/2016 Final report   Final  . Fungus (Mycology) Culture 10/07/2016 Final report   Final    Comment: (NOTE) Performed At: [facility] [place], [place] [id number] Lindon Romp MD YS:[id number]   . Fungal Source 10/07/2016 CSF   Final  . Glucose-Capillary 10/07/2016 324* 65 - 99 mg/dL Final  . Glucose-Capillary 10/07/2016 221* 65 - 99 mg/dL Final  . Comment 1 10/07/2016 Notify RN   Final  . Glucose-Capillary 10/08/2016 190* 65 - 99 mg/dL Final  . Glucose-Capillary 10/08/2016 310* 65 - 99 mg/dL Final  . Glucose-Capillary 10/08/2016 215* 65 - 99 mg/dL Final  . WBC 10/09/2016 13.4* 4.0 - 10.5 K/uL Final  . RBC 10/09/2016 4.65  3.87 - 5.11 MIL/uL Final  . Hemoglobin 10/09/2016 13.4  12.0 - 15.0 g/dL Final  . HCT 10/09/2016 40.6  36.0 - 46.0 % Final  . MCV 10/09/2016 87.3  78.0 - 100.0 fL Final  . MCH 10/09/2016 28.8  26.0 - 34.0 pg Final  . MCHC 10/09/2016 33.0  30.0 - 36.0 g/dL Final  . RDW 10/09/2016 14.7  11.5 - 15.5 % Final  . Platelets 10/09/2016 302  150 - 400 K/uL Final  . Sodium 10/09/2016 136  135 - 145 mmol/L Final  . Potassium 10/09/2016 3.5  3.5 - 5.1 mmol/L Final  . Chloride 10/09/2016 100* 101 - 111 mmol/L Final  . CO2 10/09/2016 27  22 - 32 mmol/L Final  . Glucose, Bld 10/09/2016 191* 65 - 99 mg/dL Final  . BUN 10/09/2016 21* 6 - 20 mg/dL Final  . Creatinine, Ser 10/09/2016 0.57  0.44 - 1.00 mg/dL Final  . Calcium 10/09/2016 9.2  8.9 - 10.3 mg/dL Final  . GFR calc non Af Amer 10/09/2016 >60  >60 mL/min Final  . GFR calc Af Amer 10/09/2016 >60  >60 mL/min Final   Comment: (NOTE) The eGFR has been calculated using the CKD EPI equation. This calculation has not been validated in all clinical situations. eGFR's persistently <60 mL/min signify possible Chronic Kidney Disease.   . Anion gap 10/09/2016 9  5 - 15 Final  . Glucose-Capillary 10/08/2016 182* 65 - 99 mg/dL Final  . Glucose-Capillary 10/09/2016 175* 65 - 99 mg/dL Final  . Result 1 10/07/2016 Comment   Final   Comment: (NOTE) KOH/Calcofluor preparation:  no fungus  observed. Performed At: [facility] [place], [place] [id number] Lindon Romp MD [id number]   . Glucose-Capillary 10/09/2016 317* 65 - 99 mg/dL Final  . Glucose-Capillary 10/09/2016 211* 65 - 99 mg/dL Final  . Glucose-Capillary 10/09/2016 228* 65 - 99 mg/dL Final  . Glucose-Capillary 10/10/2016 141* 65 - 99 mg/dL Final  . Glucose-Capillary 10/10/2016 161* 65 - 99 mg/dL Final  . Glucose-Capillary 10/10/2016 326* 65 - 99 mg/dL Final  . Glucose-Capillary 10/10/2016 185* 65 - 99 mg/dL Final  . Glucose-Capillary 10/11/2016 151* 65 - 99 mg/dL Final  . Glucose-Capillary 10/11/2016 305* 65 - 99 mg/dL Final  . Fungal result 1 10/07/2016 Comment   Final   Comment: (NOTE) No yeast or mold isolated after 4 weeks. Performed At: [facility] [place], [place] [id number] Lindon Romp MD YW:[id number]     No results found.   Assessment/Plan   ICD-10-CM   1. Type 2 diabetes mellitus with hypoglycemia without coma, with long-term current use of insulin (HCC) E11.649    Z79.4   2. Cerebral amyloid angiopathy (HCC) E85.4    I68.0  3. Vasogenic brain edema (HCC) G93.6   4. Hypertension, essential, benign I10   5. Hypothyroidism due to acquired atrophy of thyroid E03.4     Will have dietary talk to her about menu options  Refer for annual diabetic eye exam and foot exam  Offer healthy snack between noon and evening meals  Cont current meds as ordered  Follow CBGs closely  F/u with neurology as scheduled  PT/OT as indicated  Will follow  Hershall Benkert S. Perlie Gold  [facility] [address] [place], [place] [address] [phone] Cell (Monday-Friday 8 AM - 5 PM) [phone] After 5 PM and follow prompts

## 2017-01-10 DIAGNOSIS — Z5181 Encounter for therapeutic drug level monitoring: Secondary | ICD-10-CM | POA: Diagnosis not present

## 2017-01-10 DIAGNOSIS — E039 Hypothyroidism, unspecified: Secondary | ICD-10-CM | POA: Diagnosis not present

## 2017-01-10 DIAGNOSIS — E119 Type 2 diabetes mellitus without complications: Secondary | ICD-10-CM | POA: Diagnosis not present

## 2017-01-10 DIAGNOSIS — E785 Hyperlipidemia, unspecified: Secondary | ICD-10-CM | POA: Diagnosis not present

## 2017-01-10 LAB — TSH: TSH: 0.62 (ref 0.41–5.90)

## 2017-01-10 LAB — HEMOGLOBIN A1C: Hemoglobin A1C: 6.7

## 2017-01-10 LAB — LIPID PANEL
Cholesterol: 223 — AB (ref 0–200)
HDL: 70 (ref 35–70)
LDL Cholesterol: 122
Triglycerides: 152 (ref 40–160)

## 2017-01-14 ENCOUNTER — Encounter: Payer: Self-pay | Admitting: Adult Health

## 2017-01-14 ENCOUNTER — Non-Acute Institutional Stay (SKILLED_NURSING_FACILITY): Payer: Medicare Other | Admitting: Adult Health

## 2017-01-14 DIAGNOSIS — Z5181 Encounter for therapeutic drug level monitoring: Secondary | ICD-10-CM | POA: Diagnosis not present

## 2017-01-14 DIAGNOSIS — E785 Hyperlipidemia, unspecified: Secondary | ICD-10-CM | POA: Diagnosis not present

## 2017-01-14 DIAGNOSIS — E034 Atrophy of thyroid (acquired): Secondary | ICD-10-CM | POA: Diagnosis not present

## 2017-01-14 DIAGNOSIS — E1169 Type 2 diabetes mellitus with other specified complication: Secondary | ICD-10-CM

## 2017-01-14 DIAGNOSIS — E119 Type 2 diabetes mellitus without complications: Secondary | ICD-10-CM | POA: Diagnosis not present

## 2017-01-14 DIAGNOSIS — E039 Hypothyroidism, unspecified: Secondary | ICD-10-CM | POA: Diagnosis not present

## 2017-01-14 LAB — LIPID PANEL
Cholesterol: 212 — AB (ref 0–200)
HDL: 67 (ref 35–70)
LDL CALC: 121
TRIGLYCERIDES: 118 (ref 40–160)

## 2017-01-14 LAB — TSH: TSH: 1.53 (ref 0.41–5.90)

## 2017-01-14 NOTE — Progress Notes (Signed)
Location:   [facility] Room Number: 120 A Place of Service:  SNF (31)   CODE STATUS: DNR  Allergies  Allergen Reactions  . Zestril [Lisinopril] Shortness Of Breath  . Norvasc [Amlodipine Besylate] Itching    Patient tolerates amlodipine and takes at home.   . Tenormin [Atenolol] Other (See Comments)    fatigue    Chief Complaint  Patient presents with  . Acute Visit    Diabetes Melllitus    HPI:  The care plan team and myself have reviewed her diabetes. Over the past month her cbgs range from 82-230. She is of advanced age [age]) and as such the cbg readings do not need to be so tightly managed; for concerns of low glucose levels. Her hgb a1c is 6.7.  She denies any symptoms of low or elevated cbg. She tells me that she is feeling good.    Past Medical History:  Diagnosis Date  . Cardiac murmur   . Dizziness   . Hyperlipidemia   . Hypertension   . Pre-diabetes   . Syncope and collapse   . Thyroid disease   . Vitamin D deficiency     Past Surgical History:  Procedure Laterality Date  . TONSILLECTOMY      Social History   Social History  . Marital status: Single    Spouse name: [name]  . Number of children: [name]  . Years of education: [name]   Occupational History  . Not on file.   Social History Main Topics  . Smoking status: Never Smoker  . Smokeless tobacco: Never Used  . Alcohol use No  . Drug use: No  . Sexual activity: Not on file   Other Topics Concern  . Not on file   Social History Narrative  . No narrative on file   Family History  Problem Relation Age of Onset  . Stroke Mother   . Alzheimer's disease Father       VITAL SIGNS BP 118/78   Pulse 74   Ht 5\' 8"  (1.727 m)   Wt 134 lb 9.6 oz (61.1 kg)   BMI 20.47 kg/m   Patient's Medications  New Prescriptions   No medications on file  Previous Medications   ACETAMINOPHEN (TYLENOL) 325 MG TABLET    Take 650 mg by mouth every 6 (six) hours as needed for mild pain.   AMLODIPINE (NORVASC) 10 MG TABLET    Take 10 mg by mouth daily.   INSULIN ASPART (NOVOLOG) 100 UNIT/ML INJECTION    Inject 8 Units into the skin. After meals   LEVOTHYROXINE (SYNTHROID, LEVOTHROID) 50 MCG TABLET    Take 50 mcg by mouth daily before breakfast.   METFORMIN (GLUCOPHAGE) 500 MG TABLET    Take 500 mg by mouth daily with breakfast.   POLYVINYL ALCOHOL (LIQUIFILM TEARS) 1.4 % OPHTHALMIC SOLUTION    Place 1 drop into both eyes 3 (three) times daily.   UNABLE TO FIND    HSG CCD 2 Gram Sodium Diet, Regular Texture, Regular Consistency  Modified Medications   No medications on file  Discontinued Medications   No medications on file     SIGNIFICANT DIAGNOSTIC EXAMS PREVIOUS  10-05-16: wbc 11.3; hgb 11.8; hct 36.3; mcv 86.6; plt 281; glucose 137; bun 29; creat 0.87; k+ 3.6; na++ 134; ;liver normal albumin 3.7; tsh 0.493; hgb a1c 6.8; urine culture: e-coli: keflex 10-06-16: wbc 7.9; hgb 11.0; hct 33.5 ;mcv 87.5; plt 258; glucose 114; bun 15; creat 0.66; k+ 2.8; na++  135; vit D 42.4 10-07-16: wbc 7.5; hgb 11.8; hct 36.9; mcv 87.9; plt 287; glucose 226; bun 20; creat 0.62; k+ 4.4; na++ 135 10-09-16: wbc 13.4; hgb 13.4; hct 40.6; mcv 87.3; plt 302; glucose 191; bun 21; creat 0.57; k+ 3.5; na++ 136  10-16-16: wbc 8.0 hgb 12.0; hct 36.9; mcv 91.2 ;plt 161; glucose 126; bun 19; creat 0.43; k+ 4.2; na++ 139;   REVIEWED TODAY:   01-10-17: tsh 0.62; hgb a1c 6.7; chol 223; ldl 122; trig 152; hdl 70    .Review of Systems  Constitutional: Negative for malaise/fatigue.  HENT: Negative.   Respiratory: Negative for cough and shortness of breath.   Cardiovascular: Negative for chest pain, palpitations and leg swelling.  Gastrointestinal: Negative for abdominal pain, constipation and heartburn.  Musculoskeletal: Negative for back pain, joint pain and myalgias.  Skin: Negative.   Neurological: Negative for dizziness.  Psychiatric/Behavioral: The patient is not nervous/anxious.      Physical Exam    Constitutional: No distress.  Eyes: Conjunctivae are normal.  Neck: Neck supple. No JVD present. No thyromegaly present.  Cardiovascular: Normal rate, regular rhythm and intact distal pulses.   Murmur heard. Respiratory: Effort normal and breath sounds normal. No respiratory distress. She has no wheezes.  GI: Soft. Bowel sounds are normal. She exhibits no distension. There is no tenderness.  Musculoskeletal: She exhibits no edema.  Able to move all extremities  Ambulates throughout the facility   Lymphadenopathy:    She has no cervical adenopathy.  Neurological: She is alert.  Skin: Skin is warm and dry. She is not diaphoretic.  Psychiatric: She has a normal mood and affect.   ASSESSMENT/ PLAN:   TODAY  1. Diabetes: hgb a1c 6.8 (previous 6.7) she is doing well will continue metformin 500 mg daily and will stop the novolog insulin at this time; will reduce her cbgs to one time daily and will monitor   2. Hypothyroidism: tsh is 0.62 she is presently stable will continue synthroid 50 mcg daily   3. Dyslipidemia: ldl 122; is worse; will begin her on lipitor 10 mg daily and will check cmp and lipids in 6 weeks.   PREVIOUS:   4. Hypertension: b/p: 126/80  will continue norvasc 10 mg daily     Synthia Innocent NP [facility]  Contact [phone] Monday through Friday 8am- 5pm  After hours call [phone]

## 2017-01-23 DIAGNOSIS — I680 Cerebral amyloid angiopathy: Secondary | ICD-10-CM | POA: Diagnosis not present

## 2017-01-24 DIAGNOSIS — I680 Cerebral amyloid angiopathy: Secondary | ICD-10-CM | POA: Diagnosis not present

## 2017-01-27 DIAGNOSIS — I680 Cerebral amyloid angiopathy: Secondary | ICD-10-CM | POA: Diagnosis not present

## 2017-01-28 DIAGNOSIS — E785 Hyperlipidemia, unspecified: Secondary | ICD-10-CM

## 2017-01-28 DIAGNOSIS — E1169 Type 2 diabetes mellitus with other specified complication: Secondary | ICD-10-CM | POA: Insufficient documentation

## 2017-01-28 DIAGNOSIS — I680 Cerebral amyloid angiopathy: Secondary | ICD-10-CM | POA: Diagnosis not present

## 2017-01-29 DIAGNOSIS — I680 Cerebral amyloid angiopathy: Secondary | ICD-10-CM | POA: Diagnosis not present

## 2017-01-30 DIAGNOSIS — I680 Cerebral amyloid angiopathy: Secondary | ICD-10-CM | POA: Diagnosis not present

## 2017-01-31 DIAGNOSIS — I680 Cerebral amyloid angiopathy: Secondary | ICD-10-CM | POA: Diagnosis not present

## 2017-02-03 DIAGNOSIS — I680 Cerebral amyloid angiopathy: Secondary | ICD-10-CM | POA: Diagnosis not present

## 2017-02-04 DIAGNOSIS — I680 Cerebral amyloid angiopathy: Secondary | ICD-10-CM | POA: Diagnosis not present

## 2017-02-05 DIAGNOSIS — M6281 Muscle weakness (generalized): Secondary | ICD-10-CM | POA: Diagnosis not present

## 2017-02-06 ENCOUNTER — Encounter: Payer: Self-pay | Admitting: Internal Medicine

## 2017-02-06 ENCOUNTER — Non-Acute Institutional Stay (SKILLED_NURSING_FACILITY): Payer: Medicare Other | Admitting: Internal Medicine

## 2017-02-06 DIAGNOSIS — E1169 Type 2 diabetes mellitus with other specified complication: Secondary | ICD-10-CM

## 2017-02-06 DIAGNOSIS — E119 Type 2 diabetes mellitus without complications: Secondary | ICD-10-CM

## 2017-02-06 DIAGNOSIS — R413 Other amnesia: Secondary | ICD-10-CM | POA: Diagnosis not present

## 2017-02-06 DIAGNOSIS — G936 Cerebral edema: Secondary | ICD-10-CM

## 2017-02-06 DIAGNOSIS — E785 Hyperlipidemia, unspecified: Secondary | ICD-10-CM

## 2017-02-06 DIAGNOSIS — E034 Atrophy of thyroid (acquired): Secondary | ICD-10-CM | POA: Diagnosis not present

## 2017-02-06 DIAGNOSIS — I10 Essential (primary) hypertension: Secondary | ICD-10-CM | POA: Diagnosis not present

## 2017-02-06 DIAGNOSIS — L989 Disorder of the skin and subcutaneous tissue, unspecified: Secondary | ICD-10-CM

## 2017-02-06 DIAGNOSIS — E854 Organ-limited amyloidosis: Secondary | ICD-10-CM

## 2017-02-06 DIAGNOSIS — I680 Cerebral amyloid angiopathy: Secondary | ICD-10-CM

## 2017-02-06 DIAGNOSIS — M6281 Muscle weakness (generalized): Secondary | ICD-10-CM | POA: Diagnosis not present

## 2017-02-06 NOTE — Progress Notes (Signed)
Patient ID: Christina DubinBarbara Hobbs, female   DOB: Dec 26, 1930, 83 y.o.   MRN: [id number]     DATE:  February 06, 2017  Location:   [facility] Room Number: 120 A Place of Service: SNF (31)   Extended Emergency Contact Information Primary Emergency Contact: Christina Furnacearothers,Christina          [place], [place] Home Phone: [phone] Mobile Phone: [phone] Relation: Sister  Advanced Directive information Does Patient Have a Medical Advance Directive?: Yes, Type of Advance Directive: Out of facility DNR (pink MOST or yellow form), Pre-existing out of facility DNR order (yellow form or pink MOST form): Pink MOST form placed in chart (order not valid for inpatient use), Does patient want to make changes to medical advance directive?: No - Patient declined  Chief Complaint  Patient presents with  . Medical Management of Chronic Issues    1 month follow up    HPI:  83 yo female long term resident seen today for f/u. She has not seen neurologist yet. She denies any new confusion, disorientation, gait issues or memory loss. She does repeat herself during our discussion. Appetite ok. She does not sleep well due to noise in hallway. No nursing issues. No falls. She states her sister is attempting to [place] closer to them. She is a poor historian due to memory loss. Hx obtained from chart  Hypertension - stable on norvasc 10 mg daily   Hypothyroidism - stable on synthroid 50 mcg daily. TSH 1.53  DM - controlled. A1c 6.6%. Takes metformin 500 mg daily. She is off insulin tx. CBG 110-160s; occasionally 210-280s; rarely >400. Occasional low BS reactions in between noon meal and evening meal. LDL 121. She is ACEI intolerant  Cerebral amyloid angiopathy/ vasogenic brain edema - s/p IV solumedrol; she has not seen neurology yet  Hyperlipidemia - not at goal. LDL 121. She was started on lipitor 10mg  last month.  Past Medical History:  Diagnosis Date  .  Cardiac murmur   . Dizziness   . Hyperlipidemia   . Hypertension   . Pre-diabetes   . Syncope and collapse   . Thyroid disease   . Vitamin D deficiency     Past Surgical History:  Procedure Laterality Date  . TONSILLECTOMY      Patient Care Team: Hobbs, Christina DanceVictoria R, MD as PCP - General (Family Medicine) Hobbs, Christina DanceVictoria R, MD as Consulting Physician [facility](Family Medicine)  Social History   Social History  . Marital status: Single    Spouse name: [name]  . Number of children: [name]  . Years of education: [name]   Occupational History  . Not on file.   Social History Main Topics  . Smoking status: Never Smoker  . Smokeless tobacco: Never Used  . Alcohol use No  . Drug use: No  . Sexual activity: Not on file   Other Topics Concern  . Not on file   Social History Narrative  . No narrative on file     reports that she has never smoked. She has never used smokeless tobacco. She reports that she does not drink alcohol or use drugs.  Family History  Problem Relation Age of Onset  . Stroke Mother   . Alzheimer's disease Father    Family Status  Relation Status  . Mother Deceased  . Father Deceased    Immunization History  Administered Date(s) Administered  . PPD Test 10/30/2016    Allergies  Allergen Reactions  . Zestril [Lisinopril] Shortness Of Breath  . Norvasc [Amlodipine Besylate] Itching    Patient tolerates amlodipine and takes at home.   . Tenormin [Atenolol] Other (See Comments)    fatigue    Medications: Patient's Medications  New Prescriptions   No medications on file  Previous Medications   ACETAMINOPHEN (TYLENOL) 325 MG TABLET    Take 650 mg by mouth every 6 (six) hours as needed for mild pain.   AMLODIPINE (NORVASC) 10 MG TABLET    Take 10 mg by mouth daily.   CAMPHOR-MENTHOL (SARNA) LOTION    Apply to itchy skin topically every shift for pruritus   INSULIN ASPART (NOVOLOG) 100 UNIT/ML INJECTION    Inject 8 Units into the skin. After meals    LEVOTHYROXINE (SYNTHROID, LEVOTHROID) 50 MCG TABLET    Take 50 mcg by mouth daily before breakfast.   METFORMIN (GLUCOPHAGE) 500 MG TABLET    Take 500 mg by mouth daily with breakfast.   POLYVINYL ALCOHOL (LIQUIFILM TEARS) 1.4 % OPHTHALMIC SOLUTION    Place 1 drop into both eyes 3 (three) times daily.   UNABLE TO FIND    HSG CCD 2 Gram Sodium Diet, Regular Texture, Regular Consistency  Modified Medications   No medications on file  Discontinued Medications   No medications on file    Review of Systems  Unable to perform ROS: Other (memory loss)    Vitals:   02/06/17 1407  BP: 128/70  Pulse: 75  Resp: 18  Temp: 98.9 F (37.2 C)  SpO2: 98%  Weight: 134 lb 9.6 oz (61.1 kg)  Height: 5\' 3"  (1.6 m)   Body mass index is 23.84 kg/m.  Physical Exam  Constitutional: She appears well-developed and well-nourished.  Sitting on bed in NAD  HENT:  Mouth/Throat: Oropharynx is clear and moist. No oropharyngeal exudate.  MMM; no oral thrush  Eyes: Pupils are equal, round, and reactive to light. No scleral icterus.  Neck: Neck supple. Carotid bruit is not present. No tracheal deviation present. No thyromegaly present.  Cardiovascular: Normal rate, regular rhythm and intact distal pulses.  Exam reveals no gallop and no friction rub.   Murmur (1/6 SEM) heard. No LE edema b/l. no calf TTP.   Pulmonary/Chest: Effort normal and breath sounds normal. No stridor. No respiratory distress. She has no wheezes. She has no rales.  Abdominal: Soft. Normal appearance and bowel sounds are normal. She exhibits no distension and no mass. There is no hepatomegaly. There is no tenderness. There is no rigidity, no rebound and no guarding. No hernia.  Musculoskeletal: She exhibits edema.  Lymphadenopathy:    She has no cervical adenopathy.  Neurological: She is alert.  Skin: Skin is warm and dry. Lesion noted. No rash noted.     Psychiatric: She has a normal mood and affect. Her behavior is normal. Thought  content normal.     Labs reviewed: Nursing Home on 02/06/2017  Component Date Value Ref Range Status  . Triglycerides 01/14/2017 118  40 - 160 Final  . Cholesterol 01/14/2017 212* 0 - 200 Final  . HDL 01/14/2017 67  35 - 70 Final  . LDL Cholesterol 01/14/2017 121   Final  . TSH 01/14/2017 1.53  0.41 - 5.90 Final  Abstract on 01/13/2017  Component Date Value Ref Range Status  . Triglycerides 01/10/2017 152  40 - 160 Final  . Cholesterol 01/10/2017 223* 0 - 200 Final  . HDL 01/10/2017 70  35 - 70 Final  .  LDL Cholesterol 01/10/2017 122   Final  . Hemoglobin A1C 01/10/2017 6.7   Final  . TSH 01/10/2017 0.62  0.41 - 5.90 Final    No results found.   Assessment/Plan   ICD-10-CM   1. Cerebral amyloid angiopathy (HCC) E85.4    I68.0   2. Vasogenic brain edema (HCC) G93.6   3. Skin lesion of left upper extremity L98.9    NEW  4. Hypothyroidism due to acquired atrophy of thyroid E03.4   5. Controlled type 2 diabetes mellitus without complication, without long-term current use of insulin (HCC) E11.9   6. Hypertension, essential, benign I10   7. Dyslipidemia associated with type 2 diabetes mellitus (HCC) E11.69    E78.5   8. Memory loss, short term R41.3    She declined dermatology referral for skin lesion  Refer to neurology for cerebral amyloid angiopathy/vasogenic brain edema  Cont CBGs daily and record. CBGs trending up and may need to reintroduce insulin tx  Cont other meds as ordered  PT/OT/ST as ordered  Will follow  Raenah Murley S. Ancil Linseyarter, D. O., F. A. C. O. I.  [facility] [address] [place], [place] [address] [phone] Cell (Monday-Friday 8 AM - 5 PM) [phone] After 5 PM and follow prompts

## 2017-02-07 DIAGNOSIS — M6281 Muscle weakness (generalized): Secondary | ICD-10-CM | POA: Diagnosis not present

## 2017-02-07 DIAGNOSIS — I680 Cerebral amyloid angiopathy: Secondary | ICD-10-CM | POA: Diagnosis not present

## 2017-02-07 LAB — HEPATIC FUNCTION PANEL
ALT: 9 (ref 7–35)
AST: 15 (ref 13–35)
Alkaline Phosphatase: 121 (ref 25–125)
BILIRUBIN, TOTAL: 0.3

## 2017-02-07 LAB — BASIC METABOLIC PANEL
BUN: 20 (ref 4–21)
CREATININE: 0.7 (ref 0.5–1.1)
GLUCOSE: 267
POTASSIUM: 4.8 (ref 3.4–5.3)
Sodium: 138 (ref 137–147)

## 2017-02-10 DIAGNOSIS — M6281 Muscle weakness (generalized): Secondary | ICD-10-CM | POA: Diagnosis not present

## 2017-02-11 ENCOUNTER — Non-Acute Institutional Stay (SKILLED_NURSING_FACILITY): Payer: Medicare Other | Admitting: Adult Health

## 2017-02-11 ENCOUNTER — Encounter: Payer: Self-pay | Admitting: Adult Health

## 2017-02-11 DIAGNOSIS — I10 Essential (primary) hypertension: Secondary | ICD-10-CM | POA: Diagnosis not present

## 2017-02-11 DIAGNOSIS — M6281 Muscle weakness (generalized): Secondary | ICD-10-CM | POA: Diagnosis not present

## 2017-02-11 DIAGNOSIS — G94 Other disorders of brain in diseases classified elsewhere: Secondary | ICD-10-CM | POA: Diagnosis not present

## 2017-02-11 DIAGNOSIS — E119 Type 2 diabetes mellitus without complications: Secondary | ICD-10-CM

## 2017-02-11 DIAGNOSIS — E854 Organ-limited amyloidosis: Secondary | ICD-10-CM

## 2017-02-11 NOTE — Progress Notes (Signed)
Location:   [facility] Room Number: 120 A Place of Service:  SNF (31)   CODE STATUS: DNR  Allergies  Allergen Reactions  . Zestril [Lisinopril] Shortness Of Breath  . Norvasc [Amlodipine Besylate] Itching    Patient tolerates amlodipine and takes at home.   . Tenormin [Atenolol] Other (See Comments)    fatigue    Chief Complaint  Patient presents with  . Acute Visit    Care Plan Meeting    HPI:  We have met with her sister and the care plan team. We have discussed her medications; and goals of future care. Her goal is to go to assisted living near [place] where her sister lives. She is unhappy with her roommate; but other wise has no concerns. Both she and her sister have verbalized understanding of the information given.    Past Medical History:  Diagnosis Date  . Cardiac murmur   . Dizziness   . Hyperlipidemia   . Hypertension   . Pre-diabetes   . Syncope and collapse   . Thyroid disease   . Vitamin D deficiency     Past Surgical History:  Procedure Laterality Date  . TONSILLECTOMY      Social History   Social History  . Marital status: Single    Spouse name: [name]  . Number of children: [name]  . Years of education: [name]   Occupational History  . Not on file.   Social History Main Topics  . Smoking status: Never Smoker  . Smokeless tobacco: Never Used  . Alcohol use No  . Drug use: No  . Sexual activity: Not on file   Other Topics Concern  . Not on file   Social History Narrative  . No narrative on file   Family History  Problem Relation Age of Onset  . Stroke Mother   . Alzheimer's disease Father       VITAL SIGNS BP 130/66   Pulse (!) 107   Temp 97.6 F (36.4 C)   Resp 18   Ht 5' 3" (1.6 m)   Wt 137 lb 3.2 oz (62.2 kg)   SpO2 96%   BMI 24.30 kg/m   Patient's Medications  New Prescriptions   No medications on file  Previous Medications   ACETAMINOPHEN (TYLENOL) 325 MG TABLET    Take 650 mg by mouth every 6 (six)  hours as needed for mild pain.   AMLODIPINE (NORVASC) 10 MG TABLET    Take 10 mg by mouth daily.   ATORVASTATIN (LIPITOR) 10 MG TABLET    Take 10 mg by mouth daily.   CAMPHOR-MENTHOL (SARNA) LOTION    Apply to itchy skin topically every shift for pruritus   INSULIN ASPART (NOVOLOG) 100 UNIT/ML INJECTION    Inject 8 Units into the skin. After meals   LEVOTHYROXINE (SYNTHROID, LEVOTHROID) 50 MCG TABLET    Take 50 mcg by mouth daily before breakfast.   METFORMIN (GLUCOPHAGE) 500 MG TABLET    Take 500 mg by mouth daily with breakfast.   POLYVINYL ALCOHOL (LIQUIFILM TEARS) 1.4 % OPHTHALMIC SOLUTION    Place 1 drop into both eyes 3 (three) times daily.   UNABLE TO FIND    HSG CCD 2 Gram Sodium Diet, Regular Texture, Regular Consistency  Modified Medications   No medications on file  Discontinued Medications   No medications on file     SIGNIFICANT DIAGNOSTIC EXAMS  NO NEW EXAMS   LABS REVIEWED: PREVIOUS  10-05-16: wbc 11.3; hgb  11.8; hct 36.3; mcv 86.6; plt 281; glucose 137; bun 29; creat 0.87; k+ 3.6; na++ 134; ;liver normal albumin 3.7; tsh 0.493; hgb a1c 6.8; urine culture: e-coli: keflex 10-06-16: wbc 7.9; hgb 11.0; hct 33.5 ;mcv 87.5; plt 258; glucose 114; bun 15; creat 0.66; k+ 2.8; na++ 135; vit D 42.4 10-07-16: wbc 7.5; hgb 11.8; hct 36.9; mcv 87.9; plt 287; glucose 226; bun 20; creat 0.62; k+ 4.4; na++ 135 10-09-16: wbc 13.4; hgb 13.4; hct 40.6; mcv 87.3; plt 302; glucose 191; bun 21; creat 0.57; k+ 3.5; na++ 136  10-16-16: wbc 8.0 hgb 12.0; hct 36.9; mcv 91.2 ;plt 161; glucose 126; bun 19; creat 0.43; k+ 4.2; na++ 139;  01-10-17: tsh 0.62; hgb a1c 6.7; chol 223; ldl 122; trig 152; hdl 70   TODAY:   01-14-17: tsh 1.53; hgb a1c 6.6; chol 212; ldl 121; trig 118; hdl 67 02-07-17: glucose 267; bun 19.9; creat 0.70; k+ 4.8; na++ 138; ca 9.3; liver normal albumin 4.3    Review of Systems  Constitutional: Negative for malaise/fatigue.  Respiratory: Negative for cough and shortness of breath.     Cardiovascular: Negative for chest pain, palpitations and leg swelling.  Gastrointestinal: Negative for abdominal pain, constipation and heartburn.  Musculoskeletal: Negative for back pain, joint pain and myalgias.  Skin: Negative.   Neurological: Negative for dizziness.  Psychiatric/Behavioral: The patient is not nervous/anxious.     Physical Exam  Constitutional: She is oriented to person, place, and time. No distress.  Eyes: Conjunctivae are normal.  Neck: Neck supple. No JVD present. No thyromegaly present.  Cardiovascular: Normal rate, regular rhythm and intact distal pulses.   Murmur heard. Respiratory: Effort normal and breath sounds normal. No respiratory distress. She has no wheezes.  GI: Soft. Bowel sounds are normal. She exhibits no distension. There is no tenderness.  Musculoskeletal: She exhibits no edema.  Able to move all extremities  Ambulatory   Lymphadenopathy:    She has no cervical adenopathy.  Neurological: She is alert and oriented to person, place, and time.  Skin: Skin is warm and dry. She is not diaphoretic.  Psychiatric: She has a normal mood and affect.    ASSESSMENT/ PLAN:   TODAY  1. Senile brain amyloidosis 2. Hypertension 3. Diabetes Will continue with her current plan of care.   More than 30 minutes spent with care team and patient discussing medications; future placement options.    MD is aware of resident's narcotic use and is in agreement with current plan of care. We will attempt to wean resident as apropriate     Ok Edwards NP [facility]  Contact [phone] Monday through Friday 8am- 5pm  After hours call [phone]

## 2017-02-12 DIAGNOSIS — M6281 Muscle weakness (generalized): Secondary | ICD-10-CM | POA: Diagnosis not present

## 2017-02-13 DIAGNOSIS — M6281 Muscle weakness (generalized): Secondary | ICD-10-CM | POA: Diagnosis not present

## 2017-02-14 DIAGNOSIS — M6281 Muscle weakness (generalized): Secondary | ICD-10-CM | POA: Diagnosis not present

## 2017-02-17 DIAGNOSIS — M6281 Muscle weakness (generalized): Secondary | ICD-10-CM | POA: Diagnosis not present

## 2017-02-18 DIAGNOSIS — M6281 Muscle weakness (generalized): Secondary | ICD-10-CM | POA: Diagnosis not present

## 2017-02-19 ENCOUNTER — Non-Acute Institutional Stay (SKILLED_NURSING_FACILITY): Payer: Medicare Other | Admitting: Adult Health

## 2017-02-19 ENCOUNTER — Encounter: Payer: Self-pay | Admitting: Adult Health

## 2017-02-19 DIAGNOSIS — E1169 Type 2 diabetes mellitus with other specified complication: Secondary | ICD-10-CM | POA: Diagnosis not present

## 2017-02-19 DIAGNOSIS — E854 Organ-limited amyloidosis: Secondary | ICD-10-CM | POA: Diagnosis not present

## 2017-02-19 DIAGNOSIS — M6281 Muscle weakness (generalized): Secondary | ICD-10-CM | POA: Diagnosis not present

## 2017-02-19 DIAGNOSIS — E119 Type 2 diabetes mellitus without complications: Secondary | ICD-10-CM | POA: Diagnosis not present

## 2017-02-19 DIAGNOSIS — I10 Essential (primary) hypertension: Secondary | ICD-10-CM | POA: Diagnosis not present

## 2017-02-19 DIAGNOSIS — G94 Other disorders of brain in diseases classified elsewhere: Secondary | ICD-10-CM

## 2017-02-19 DIAGNOSIS — E785 Hyperlipidemia, unspecified: Secondary | ICD-10-CM | POA: Diagnosis not present

## 2017-02-19 DIAGNOSIS — E034 Atrophy of thyroid (acquired): Secondary | ICD-10-CM | POA: Diagnosis not present

## 2017-02-19 NOTE — Progress Notes (Signed)
Location:   [facility] Room Number: 120 A Place of Service:  SNF (31)    CODE STATUS: DNR  Allergies  Allergen Reactions  . Zestril [Lisinopril] Shortness Of Breath  . Norvasc [Amlodipine Besylate] Itching    Patient tolerates amlodipine and takes at home.   . Tenormin [Atenolol] Other (See Comments)    fatigue    Chief Complaint  Patient presents with  . 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 She is excited about going to her assisted living in the near future. She will need her prescriptions written. She will not need dme; will need home health for pt. She will follow up medically with the provider at the receiving facility.    Past Medical History:  Diagnosis Date  . Cardiac murmur   . Dizziness   . Hyperlipidemia   . Hypertension   . Pre-diabetes   . Syncope and collapse   . Thyroid disease   . Vitamin D deficiency     Past Surgical History:  Procedure Laterality Date  . TONSILLECTOMY      Social History   Social History  . Marital status: Single    Spouse name: [name]  . Number of children: [name]  . Years of education: [name]   Occupational History  . Not on file.   Social History Main Topics  . Smoking status: Never Smoker  . Smokeless tobacco: Never Used  . Alcohol use No  . Drug use: No  . Sexual activity: Not on file   Other Topics Concern  . Not on file   Social History Narrative  . No narrative on file   Family History  Problem Relation Age of Onset  . Stroke Mother   . Alzheimer's disease Father     VITAL SIGNS BP 127/72   Pulse 84   Temp 97.6 F (36.4 C)   Resp 18   Ht 5\' 3"  (1.6 m)   Wt 137 lb 3.2 oz (62.2 kg)   SpO2 96%   BMI 24.30 kg/m   Patient's Medications  New Prescriptions   No medications on file  Previous Medications   ACETAMINOPHEN (TYLENOL) 325 MG TABLET     Take 650 mg by mouth every 6 (six) hours as needed for mild pain.   AMLODIPINE (NORVASC) 10 MG TABLET    Take 10 mg by mouth daily.   ATORVASTATIN (LIPITOR) 10 MG TABLET    Take 10 mg by mouth daily.   CAMPHOR-MENTHOL (SARNA) LOTION    Apply to itchy skin topically every shift for pruritus   INSULIN ASPART (NOVOLOG) 100 UNIT/ML INJECTION    Inject 8 Units into the skin. After meals   LEVOTHYROXINE (SYNTHROID, LEVOTHROID) 50 MCG TABLET    Take 50 mcg by mouth daily before breakfast.   METFORMIN (GLUCOPHAGE) 500 MG TABLET    Take 500 mg by mouth daily with breakfast.   POLYVINYL ALCOHOL (LIQUIFILM TEARS) 1.4 % OPHTHALMIC SOLUTION    Place 1 drop into both eyes 3 (three) times daily.   UNABLE TO FIND    HSG CCD 2 Gram Sodium Diet, Regular Texture, Regular Consistency  Modified Medications   No medications on file  Discontinued Medications   No medications on file     SIGNIFICANT DIAGNOSTIC EXAMS   NO NEW EXAMS   LABS REVIEWED: PREVIOUS  10-05-16: wbc 11.3; hgb  11.8; hct 36.3; mcv 86.6; plt 281; glucose 137; bun 29; creat 0.87; k+ 3.6; na++ 134; ;liver normal albumin 3.7; tsh 0.493; hgb a1c 6.8; urine culture: e-coli: keflex 10-06-16: wbc 7.9; hgb 11.0; hct 33.5 ;mcv 87.5; plt 258; glucose 114; bun 15; creat 0.66; k+ 2.8; na++ 135; vit D 42.4 10-07-16: wbc 7.5; hgb 11.8; hct 36.9; mcv 87.9; plt 287; glucose 226; bun 20; creat 0.62; k+ 4.4; na++ 135 10-09-16: wbc 13.4; hgb 13.4; hct 40.6; mcv 87.3; plt 302; glucose 191; bun 21; creat 0.57; k+ 3.5; na++ 136  10-16-16: wbc 8.0 hgb 12.0; hct 36.9; mcv 91.2 ;plt 161; glucose 126; bun 19; creat 0.43; k+ 4.2; na++ 139;  01-10-17: tsh 0.62; hgb a1c 6.7; chol 223; ldl 122; trig 152; hdl 70  01-14-17: tsh 1.53; hgb a1c 6.6; chol 212; ldl 121; trig 118; hdl 67 1-6-108-3-18: glucose 267; bun 19.9; creat 0.70; k+ 4.8; na++ 138; ca 9.3; liver normal albumin 4.3   NO NEW LABS    Review of Systems  Constitutional: Negative for malaise/fatigue.  Respiratory: Negative  for cough and shortness of breath.   Cardiovascular: Negative for chest pain, palpitations and leg swelling.  Gastrointestinal: Negative for abdominal pain, constipation and heartburn.  Musculoskeletal: Negative for back pain, joint pain and myalgias.  Skin: Negative.   Neurological: Negative for dizziness.  Psychiatric/Behavioral: The patient is not nervous/anxious.     Physical Exam  Constitutional: She is oriented to person, place, and time. No distress.  Eyes: Conjunctivae are normal.  Neck: Neck supple. No JVD present. No thyromegaly present.  Cardiovascular: Normal rate, regular rhythm and intact distal pulses.   Murmur heard. Respiratory: Effort normal and breath sounds normal. No respiratory distress. She has no wheezes.  GI: Soft. Bowel sounds are normal. She exhibits no distension. There is no tenderness.  Musculoskeletal: She exhibits no edema.  Able to move all extremities Ambulatory   Lymphadenopathy:    She has no cervical adenopathy.  Neurological: She is alert and oriented to person, place, and time.  Skin: Skin is warm and dry. She is not diaphoretic.  Psychiatric: She has a normal mood and affect.    ASSESSMENT/ PLAN:  Patient is being discharged with the following home health services:  PT to evaluate and treat as indicated for gait and strength   Patient is being discharged with the following durable medical equipment:  None required   Patient has been advised to f/u with their PCP in 1-2 weeks to bring them up to date on their rehab stay.  Social services at facility was responsible for arranging this appointment.  Pt was provided with a 30 day supply of prescriptions for medications and refills must be obtained from their PCP.  For controlled substances, a more limited supply may be provided adequate until PCP appointment only.   Time spent with the discharge process: 40 minutes.    Synthia Innocenteborah Lanore Renderos NP [facility]  Contact [phone] Monday  through Friday 8am- 5pm  After hours call [phone]

## 2017-03-14 DIAGNOSIS — Z79899 Other long term (current) drug therapy: Secondary | ICD-10-CM | POA: Diagnosis not present

## 2017-03-14 DIAGNOSIS — I10 Essential (primary) hypertension: Secondary | ICD-10-CM | POA: Diagnosis not present

## 2017-03-14 DIAGNOSIS — R54 Age-related physical debility: Secondary | ICD-10-CM | POA: Diagnosis not present

## 2017-03-14 DIAGNOSIS — E119 Type 2 diabetes mellitus without complications: Secondary | ICD-10-CM | POA: Diagnosis not present

## 2017-03-14 DIAGNOSIS — E559 Vitamin D deficiency, unspecified: Secondary | ICD-10-CM | POA: Diagnosis not present

## 2017-03-14 DIAGNOSIS — M1990 Unspecified osteoarthritis, unspecified site: Secondary | ICD-10-CM | POA: Diagnosis not present

## 2017-03-14 DIAGNOSIS — E785 Hyperlipidemia, unspecified: Secondary | ICD-10-CM | POA: Diagnosis not present

## 2017-03-14 DIAGNOSIS — E039 Hypothyroidism, unspecified: Secondary | ICD-10-CM | POA: Diagnosis not present

## 2017-03-18 DIAGNOSIS — E119 Type 2 diabetes mellitus without complications: Secondary | ICD-10-CM | POA: Diagnosis not present

## 2017-03-18 DIAGNOSIS — M1990 Unspecified osteoarthritis, unspecified site: Secondary | ICD-10-CM | POA: Diagnosis not present

## 2017-03-18 DIAGNOSIS — R54 Age-related physical debility: Secondary | ICD-10-CM | POA: Diagnosis not present

## 2017-03-18 DIAGNOSIS — E559 Vitamin D deficiency, unspecified: Secondary | ICD-10-CM | POA: Diagnosis not present

## 2017-03-18 DIAGNOSIS — K5900 Constipation, unspecified: Secondary | ICD-10-CM | POA: Diagnosis not present

## 2017-03-18 DIAGNOSIS — I10 Essential (primary) hypertension: Secondary | ICD-10-CM | POA: Diagnosis not present

## 2017-03-18 DIAGNOSIS — G3184 Mild cognitive impairment, so stated: Secondary | ICD-10-CM | POA: Diagnosis not present

## 2017-03-18 DIAGNOSIS — E039 Hypothyroidism, unspecified: Secondary | ICD-10-CM | POA: Diagnosis not present

## 2017-03-18 DIAGNOSIS — E785 Hyperlipidemia, unspecified: Secondary | ICD-10-CM | POA: Diagnosis not present

## 2017-03-25 ENCOUNTER — Ambulatory Visit: Payer: Medicare Other | Admitting: Neurology

## 2017-03-25 ENCOUNTER — Telehealth: Payer: Self-pay | Admitting: *Deleted

## 2017-03-25 DIAGNOSIS — E039 Hypothyroidism, unspecified: Secondary | ICD-10-CM | POA: Diagnosis not present

## 2017-03-25 DIAGNOSIS — K5900 Constipation, unspecified: Secondary | ICD-10-CM | POA: Diagnosis not present

## 2017-03-25 DIAGNOSIS — E119 Type 2 diabetes mellitus without complications: Secondary | ICD-10-CM | POA: Diagnosis not present

## 2017-03-25 DIAGNOSIS — E785 Hyperlipidemia, unspecified: Secondary | ICD-10-CM | POA: Diagnosis not present

## 2017-03-25 DIAGNOSIS — M1990 Unspecified osteoarthritis, unspecified site: Secondary | ICD-10-CM | POA: Diagnosis not present

## 2017-03-25 DIAGNOSIS — I10 Essential (primary) hypertension: Secondary | ICD-10-CM | POA: Diagnosis not present

## 2017-03-25 DIAGNOSIS — E559 Vitamin D deficiency, unspecified: Secondary | ICD-10-CM | POA: Diagnosis not present

## 2017-03-25 DIAGNOSIS — R54 Age-related physical debility: Secondary | ICD-10-CM | POA: Diagnosis not present

## 2017-03-25 DIAGNOSIS — G3184 Mild cognitive impairment, so stated: Secondary | ICD-10-CM | POA: Diagnosis not present

## 2017-03-25 DIAGNOSIS — Z79899 Other long term (current) drug therapy: Secondary | ICD-10-CM | POA: Diagnosis not present

## 2017-03-25 NOTE — Telephone Encounter (Signed)
New pt. appt. no showed. Pt. is a resident at [facility]. New pt. appt. was confirmed 02/13/17 with Tamika/fim

## 2017-03-26 ENCOUNTER — Encounter: Payer: Self-pay | Admitting: Neurology

## 2017-03-28 DIAGNOSIS — M6281 Muscle weakness (generalized): Secondary | ICD-10-CM | POA: Diagnosis not present

## 2017-03-28 DIAGNOSIS — R2681 Unsteadiness on feet: Secondary | ICD-10-CM | POA: Diagnosis not present

## 2017-03-28 DIAGNOSIS — M1990 Unspecified osteoarthritis, unspecified site: Secondary | ICD-10-CM | POA: Diagnosis not present

## 2017-03-28 DIAGNOSIS — I10 Essential (primary) hypertension: Secondary | ICD-10-CM | POA: Diagnosis not present

## 2017-03-28 DIAGNOSIS — R262 Difficulty in walking, not elsewhere classified: Secondary | ICD-10-CM | POA: Diagnosis not present

## 2017-03-28 DIAGNOSIS — E119 Type 2 diabetes mellitus without complications: Secondary | ICD-10-CM | POA: Diagnosis not present

## 2017-03-31 DIAGNOSIS — R262 Difficulty in walking, not elsewhere classified: Secondary | ICD-10-CM | POA: Diagnosis not present

## 2017-03-31 DIAGNOSIS — R2681 Unsteadiness on feet: Secondary | ICD-10-CM | POA: Diagnosis not present

## 2017-03-31 DIAGNOSIS — M6281 Muscle weakness (generalized): Secondary | ICD-10-CM | POA: Diagnosis not present

## 2017-04-01 DIAGNOSIS — E119 Type 2 diabetes mellitus without complications: Secondary | ICD-10-CM | POA: Diagnosis not present

## 2017-04-01 DIAGNOSIS — G3184 Mild cognitive impairment, so stated: Secondary | ICD-10-CM | POA: Diagnosis not present

## 2017-04-01 DIAGNOSIS — E559 Vitamin D deficiency, unspecified: Secondary | ICD-10-CM | POA: Diagnosis not present

## 2017-04-01 DIAGNOSIS — I10 Essential (primary) hypertension: Secondary | ICD-10-CM | POA: Diagnosis not present

## 2017-04-01 DIAGNOSIS — R54 Age-related physical debility: Secondary | ICD-10-CM | POA: Diagnosis not present

## 2017-04-01 DIAGNOSIS — M1990 Unspecified osteoarthritis, unspecified site: Secondary | ICD-10-CM | POA: Diagnosis not present

## 2017-04-01 DIAGNOSIS — Z66 Do not resuscitate: Secondary | ICD-10-CM | POA: Diagnosis not present

## 2017-04-01 DIAGNOSIS — K5900 Constipation, unspecified: Secondary | ICD-10-CM | POA: Diagnosis not present

## 2017-04-01 DIAGNOSIS — E039 Hypothyroidism, unspecified: Secondary | ICD-10-CM | POA: Diagnosis not present

## 2017-04-02 DIAGNOSIS — M6281 Muscle weakness (generalized): Secondary | ICD-10-CM | POA: Diagnosis not present

## 2017-04-02 DIAGNOSIS — R2681 Unsteadiness on feet: Secondary | ICD-10-CM | POA: Diagnosis not present

## 2017-04-02 DIAGNOSIS — R262 Difficulty in walking, not elsewhere classified: Secondary | ICD-10-CM | POA: Diagnosis not present

## 2017-04-06 DIAGNOSIS — R262 Difficulty in walking, not elsewhere classified: Secondary | ICD-10-CM | POA: Diagnosis not present

## 2017-04-06 DIAGNOSIS — R2681 Unsteadiness on feet: Secondary | ICD-10-CM | POA: Diagnosis not present

## 2017-04-06 DIAGNOSIS — M6281 Muscle weakness (generalized): Secondary | ICD-10-CM | POA: Diagnosis not present

## 2017-04-09 DIAGNOSIS — R262 Difficulty in walking, not elsewhere classified: Secondary | ICD-10-CM | POA: Diagnosis not present

## 2017-04-09 DIAGNOSIS — M6281 Muscle weakness (generalized): Secondary | ICD-10-CM | POA: Diagnosis not present

## 2017-04-09 DIAGNOSIS — R2681 Unsteadiness on feet: Secondary | ICD-10-CM | POA: Diagnosis not present

## 2017-04-11 DIAGNOSIS — R262 Difficulty in walking, not elsewhere classified: Secondary | ICD-10-CM | POA: Diagnosis not present

## 2017-04-11 DIAGNOSIS — E119 Type 2 diabetes mellitus without complications: Secondary | ICD-10-CM | POA: Diagnosis not present

## 2017-04-11 DIAGNOSIS — R4181 Age-related cognitive decline: Secondary | ICD-10-CM | POA: Diagnosis not present

## 2017-04-11 DIAGNOSIS — I10 Essential (primary) hypertension: Secondary | ICD-10-CM | POA: Diagnosis not present

## 2017-04-11 DIAGNOSIS — G4751 Confusional arousals: Secondary | ICD-10-CM | POA: Diagnosis not present

## 2017-04-15 DIAGNOSIS — M1990 Unspecified osteoarthritis, unspecified site: Secondary | ICD-10-CM | POA: Diagnosis not present

## 2017-04-15 DIAGNOSIS — K219 Gastro-esophageal reflux disease without esophagitis: Secondary | ICD-10-CM | POA: Diagnosis not present

## 2017-04-15 DIAGNOSIS — I10 Essential (primary) hypertension: Secondary | ICD-10-CM | POA: Diagnosis not present

## 2017-04-15 DIAGNOSIS — N390 Urinary tract infection, site not specified: Secondary | ICD-10-CM | POA: Diagnosis not present

## 2017-04-15 DIAGNOSIS — R269 Unspecified abnormalities of gait and mobility: Secondary | ICD-10-CM | POA: Diagnosis not present

## 2017-04-15 DIAGNOSIS — E119 Type 2 diabetes mellitus without complications: Secondary | ICD-10-CM | POA: Diagnosis not present

## 2017-04-15 DIAGNOSIS — R5381 Other malaise: Secondary | ICD-10-CM | POA: Diagnosis not present

## 2017-04-15 DIAGNOSIS — R54 Age-related physical debility: Secondary | ICD-10-CM | POA: Diagnosis not present

## 2017-04-16 DIAGNOSIS — R262 Difficulty in walking, not elsewhere classified: Secondary | ICD-10-CM | POA: Diagnosis not present

## 2017-04-16 DIAGNOSIS — I680 Cerebral amyloid angiopathy: Secondary | ICD-10-CM | POA: Diagnosis not present

## 2017-04-16 DIAGNOSIS — R41841 Cognitive communication deficit: Secondary | ICD-10-CM | POA: Diagnosis not present

## 2017-04-16 DIAGNOSIS — R531 Weakness: Secondary | ICD-10-CM | POA: Diagnosis not present

## 2017-04-16 DIAGNOSIS — B9620 Unspecified Escherichia coli [E. coli] as the cause of diseases classified elsewhere: Secondary | ICD-10-CM | POA: Diagnosis present

## 2017-04-16 DIAGNOSIS — E039 Hypothyroidism, unspecified: Secondary | ICD-10-CM | POA: Diagnosis present

## 2017-04-16 DIAGNOSIS — M6281 Muscle weakness (generalized): Secondary | ICD-10-CM | POA: Diagnosis not present

## 2017-04-16 DIAGNOSIS — R2681 Unsteadiness on feet: Secondary | ICD-10-CM | POA: Diagnosis not present

## 2017-04-16 DIAGNOSIS — E119 Type 2 diabetes mellitus without complications: Secondary | ICD-10-CM | POA: Diagnosis present

## 2017-04-16 DIAGNOSIS — Z7984 Long term (current) use of oral hypoglycemic drugs: Secondary | ICD-10-CM | POA: Diagnosis not present

## 2017-04-16 DIAGNOSIS — I69311 Memory deficit following cerebral infarction: Secondary | ICD-10-CM | POA: Diagnosis not present

## 2017-04-16 DIAGNOSIS — I10 Essential (primary) hypertension: Secondary | ICD-10-CM | POA: Diagnosis not present

## 2017-04-16 DIAGNOSIS — R401 Stupor: Secondary | ICD-10-CM | POA: Diagnosis not present

## 2017-04-16 DIAGNOSIS — M25551 Pain in right hip: Secondary | ICD-10-CM | POA: Diagnosis not present

## 2017-04-16 DIAGNOSIS — N390 Urinary tract infection, site not specified: Secondary | ICD-10-CM | POA: Diagnosis not present

## 2017-04-16 DIAGNOSIS — E1165 Type 2 diabetes mellitus with hyperglycemia: Secondary | ICD-10-CM | POA: Diagnosis present

## 2017-04-16 DIAGNOSIS — N3000 Acute cystitis without hematuria: Secondary | ICD-10-CM | POA: Diagnosis not present

## 2017-04-16 DIAGNOSIS — Z79899 Other long term (current) drug therapy: Secondary | ICD-10-CM | POA: Diagnosis not present

## 2017-04-16 DIAGNOSIS — W19XXXA Unspecified fall, initial encounter: Secondary | ICD-10-CM | POA: Diagnosis not present

## 2017-04-16 DIAGNOSIS — E854 Organ-limited amyloidosis: Secondary | ICD-10-CM | POA: Diagnosis not present

## 2017-04-16 DIAGNOSIS — R270 Ataxia, unspecified: Secondary | ICD-10-CM | POA: Diagnosis not present

## 2017-04-16 DIAGNOSIS — R410 Disorientation, unspecified: Secondary | ICD-10-CM | POA: Diagnosis not present

## 2017-04-16 DIAGNOSIS — F0150 Vascular dementia without behavioral disturbance: Secondary | ICD-10-CM | POA: Diagnosis not present

## 2017-04-16 DIAGNOSIS — R4182 Altered mental status, unspecified: Secondary | ICD-10-CM | POA: Diagnosis not present

## 2017-04-23 DIAGNOSIS — Z79899 Other long term (current) drug therapy: Secondary | ICD-10-CM | POA: Diagnosis not present

## 2017-04-23 DIAGNOSIS — B9620 Unspecified Escherichia coli [E. coli] as the cause of diseases classified elsewhere: Secondary | ICD-10-CM | POA: Diagnosis not present

## 2017-04-23 DIAGNOSIS — I69311 Memory deficit following cerebral infarction: Secondary | ICD-10-CM | POA: Diagnosis not present

## 2017-04-23 DIAGNOSIS — E119 Type 2 diabetes mellitus without complications: Secondary | ICD-10-CM | POA: Diagnosis not present

## 2017-04-23 DIAGNOSIS — M6281 Muscle weakness (generalized): Secondary | ICD-10-CM | POA: Diagnosis not present

## 2017-04-23 DIAGNOSIS — M79671 Pain in right foot: Secondary | ICD-10-CM | POA: Diagnosis not present

## 2017-04-23 DIAGNOSIS — N390 Urinary tract infection, site not specified: Secondary | ICD-10-CM | POA: Diagnosis not present

## 2017-04-23 DIAGNOSIS — R531 Weakness: Secondary | ICD-10-CM | POA: Diagnosis not present

## 2017-04-23 DIAGNOSIS — E1165 Type 2 diabetes mellitus with hyperglycemia: Secondary | ICD-10-CM | POA: Diagnosis not present

## 2017-04-23 DIAGNOSIS — I10 Essential (primary) hypertension: Secondary | ICD-10-CM | POA: Diagnosis not present

## 2017-04-23 DIAGNOSIS — F0390 Unspecified dementia without behavioral disturbance: Secondary | ICD-10-CM | POA: Diagnosis not present

## 2017-04-23 DIAGNOSIS — E854 Organ-limited amyloidosis: Secondary | ICD-10-CM | POA: Diagnosis not present

## 2017-04-23 DIAGNOSIS — I680 Cerebral amyloid angiopathy: Secondary | ICD-10-CM | POA: Diagnosis not present

## 2017-04-23 DIAGNOSIS — M1990 Unspecified osteoarthritis, unspecified site: Secondary | ICD-10-CM | POA: Diagnosis not present

## 2017-04-23 DIAGNOSIS — E1169 Type 2 diabetes mellitus with other specified complication: Secondary | ICD-10-CM | POA: Diagnosis not present

## 2017-04-23 DIAGNOSIS — R262 Difficulty in walking, not elsewhere classified: Secondary | ICD-10-CM | POA: Diagnosis not present

## 2017-04-23 DIAGNOSIS — F0150 Vascular dementia without behavioral disturbance: Secondary | ICD-10-CM | POA: Diagnosis not present

## 2017-04-23 DIAGNOSIS — Z7984 Long term (current) use of oral hypoglycemic drugs: Secondary | ICD-10-CM | POA: Diagnosis not present

## 2017-04-23 DIAGNOSIS — R2681 Unsteadiness on feet: Secondary | ICD-10-CM | POA: Diagnosis not present

## 2017-04-23 DIAGNOSIS — M79672 Pain in left foot: Secondary | ICD-10-CM | POA: Diagnosis not present

## 2017-04-23 DIAGNOSIS — L600 Ingrowing nail: Secondary | ICD-10-CM | POA: Diagnosis not present

## 2017-04-23 DIAGNOSIS — R414 Neurologic neglect syndrome: Secondary | ICD-10-CM | POA: Diagnosis not present

## 2017-04-23 DIAGNOSIS — R410 Disorientation, unspecified: Secondary | ICD-10-CM | POA: Diagnosis not present

## 2017-04-23 DIAGNOSIS — E039 Hypothyroidism, unspecified: Secondary | ICD-10-CM | POA: Diagnosis not present

## 2017-04-23 DIAGNOSIS — R41841 Cognitive communication deficit: Secondary | ICD-10-CM | POA: Diagnosis not present

## 2017-04-26 DIAGNOSIS — E854 Organ-limited amyloidosis: Secondary | ICD-10-CM | POA: Diagnosis not present

## 2017-04-26 DIAGNOSIS — R410 Disorientation, unspecified: Secondary | ICD-10-CM | POA: Diagnosis not present

## 2017-04-26 DIAGNOSIS — I10 Essential (primary) hypertension: Secondary | ICD-10-CM | POA: Diagnosis not present

## 2017-04-26 DIAGNOSIS — F0390 Unspecified dementia without behavioral disturbance: Secondary | ICD-10-CM | POA: Diagnosis not present

## 2017-04-26 DIAGNOSIS — M1990 Unspecified osteoarthritis, unspecified site: Secondary | ICD-10-CM | POA: Diagnosis not present

## 2017-04-26 DIAGNOSIS — E119 Type 2 diabetes mellitus without complications: Secondary | ICD-10-CM | POA: Diagnosis not present

## 2017-04-30 DIAGNOSIS — I680 Cerebral amyloid angiopathy: Secondary | ICD-10-CM | POA: Diagnosis not present

## 2017-04-30 DIAGNOSIS — R414 Neurologic neglect syndrome: Secondary | ICD-10-CM | POA: Diagnosis not present

## 2017-04-30 DIAGNOSIS — M6281 Muscle weakness (generalized): Secondary | ICD-10-CM | POA: Diagnosis not present

## 2017-04-30 DIAGNOSIS — R262 Difficulty in walking, not elsewhere classified: Secondary | ICD-10-CM | POA: Diagnosis not present

## 2017-05-05 DIAGNOSIS — M79672 Pain in left foot: Secondary | ICD-10-CM | POA: Diagnosis not present

## 2017-05-05 DIAGNOSIS — M79671 Pain in right foot: Secondary | ICD-10-CM | POA: Diagnosis not present

## 2017-05-05 DIAGNOSIS — E1169 Type 2 diabetes mellitus with other specified complication: Secondary | ICD-10-CM | POA: Diagnosis not present

## 2017-05-05 DIAGNOSIS — L600 Ingrowing nail: Secondary | ICD-10-CM | POA: Diagnosis not present

## 2017-05-06 DIAGNOSIS — M6281 Muscle weakness (generalized): Secondary | ICD-10-CM | POA: Diagnosis not present

## 2017-05-06 DIAGNOSIS — I680 Cerebral amyloid angiopathy: Secondary | ICD-10-CM | POA: Diagnosis not present

## 2017-05-06 DIAGNOSIS — R262 Difficulty in walking, not elsewhere classified: Secondary | ICD-10-CM | POA: Diagnosis not present

## 2017-05-06 DIAGNOSIS — R414 Neurologic neglect syndrome: Secondary | ICD-10-CM | POA: Diagnosis not present

## 2017-05-16 DIAGNOSIS — M1990 Unspecified osteoarthritis, unspecified site: Secondary | ICD-10-CM | POA: Diagnosis not present

## 2017-05-16 DIAGNOSIS — Z9181 History of falling: Secondary | ICD-10-CM | POA: Diagnosis not present

## 2017-05-16 DIAGNOSIS — E119 Type 2 diabetes mellitus without complications: Secondary | ICD-10-CM | POA: Diagnosis not present

## 2017-05-16 DIAGNOSIS — E854 Organ-limited amyloidosis: Secondary | ICD-10-CM | POA: Diagnosis not present

## 2017-05-16 DIAGNOSIS — G308 Other Alzheimer's disease: Secondary | ICD-10-CM | POA: Diagnosis not present

## 2017-05-16 DIAGNOSIS — I10 Essential (primary) hypertension: Secondary | ICD-10-CM | POA: Diagnosis not present

## 2017-05-16 DIAGNOSIS — Z7984 Long term (current) use of oral hypoglycemic drugs: Secondary | ICD-10-CM | POA: Diagnosis not present

## 2017-05-16 DIAGNOSIS — I680 Cerebral amyloid angiopathy: Secondary | ICD-10-CM | POA: Diagnosis not present

## 2017-05-16 DIAGNOSIS — R262 Difficulty in walking, not elsewhere classified: Secondary | ICD-10-CM | POA: Diagnosis not present

## 2017-05-16 DIAGNOSIS — E039 Hypothyroidism, unspecified: Secondary | ICD-10-CM | POA: Diagnosis not present

## 2017-05-16 DIAGNOSIS — K219 Gastro-esophageal reflux disease without esophagitis: Secondary | ICD-10-CM | POA: Diagnosis not present

## 2017-05-16 DIAGNOSIS — F0150 Vascular dementia without behavioral disturbance: Secondary | ICD-10-CM | POA: Diagnosis not present

## 2017-05-16 DIAGNOSIS — R54 Age-related physical debility: Secondary | ICD-10-CM | POA: Diagnosis not present

## 2017-05-16 DIAGNOSIS — Z87440 Personal history of urinary (tract) infections: Secondary | ICD-10-CM | POA: Diagnosis not present

## 2017-05-17 DIAGNOSIS — I10 Essential (primary) hypertension: Secondary | ICD-10-CM | POA: Diagnosis not present

## 2017-05-20 DIAGNOSIS — E119 Type 2 diabetes mellitus without complications: Secondary | ICD-10-CM | POA: Diagnosis not present

## 2017-05-20 DIAGNOSIS — R269 Unspecified abnormalities of gait and mobility: Secondary | ICD-10-CM | POA: Diagnosis not present

## 2017-05-20 DIAGNOSIS — G3184 Mild cognitive impairment, so stated: Secondary | ICD-10-CM | POA: Diagnosis not present

## 2017-05-20 DIAGNOSIS — R5381 Other malaise: Secondary | ICD-10-CM | POA: Diagnosis not present

## 2017-05-20 DIAGNOSIS — I10 Essential (primary) hypertension: Secondary | ICD-10-CM | POA: Diagnosis not present

## 2017-05-20 DIAGNOSIS — R4182 Altered mental status, unspecified: Secondary | ICD-10-CM | POA: Diagnosis not present

## 2017-05-20 DIAGNOSIS — R54 Age-related physical debility: Secondary | ICD-10-CM | POA: Diagnosis not present

## 2017-05-20 DIAGNOSIS — M1990 Unspecified osteoarthritis, unspecified site: Secondary | ICD-10-CM | POA: Diagnosis not present

## 2017-05-21 DIAGNOSIS — M1990 Unspecified osteoarthritis, unspecified site: Secondary | ICD-10-CM | POA: Diagnosis not present

## 2017-05-21 DIAGNOSIS — I10 Essential (primary) hypertension: Secondary | ICD-10-CM | POA: Diagnosis not present

## 2017-05-21 DIAGNOSIS — I680 Cerebral amyloid angiopathy: Secondary | ICD-10-CM | POA: Diagnosis not present

## 2017-05-21 DIAGNOSIS — E119 Type 2 diabetes mellitus without complications: Secondary | ICD-10-CM | POA: Diagnosis not present

## 2017-05-21 DIAGNOSIS — E854 Organ-limited amyloidosis: Secondary | ICD-10-CM | POA: Diagnosis not present

## 2017-05-21 DIAGNOSIS — F0150 Vascular dementia without behavioral disturbance: Secondary | ICD-10-CM | POA: Diagnosis not present

## 2017-05-22 DIAGNOSIS — H2513 Age-related nuclear cataract, bilateral: Secondary | ICD-10-CM | POA: Diagnosis not present

## 2017-05-22 DIAGNOSIS — H3530 Unspecified macular degeneration: Secondary | ICD-10-CM | POA: Diagnosis not present

## 2017-05-22 DIAGNOSIS — E119 Type 2 diabetes mellitus without complications: Secondary | ICD-10-CM | POA: Diagnosis not present

## 2017-05-24 DIAGNOSIS — S062X0A Diffuse traumatic brain injury without loss of consciousness, initial encounter: Secondary | ICD-10-CM | POA: Diagnosis not present

## 2017-05-24 DIAGNOSIS — M6281 Muscle weakness (generalized): Secondary | ICD-10-CM | POA: Diagnosis not present

## 2017-05-24 DIAGNOSIS — R296 Repeated falls: Secondary | ICD-10-CM | POA: Diagnosis not present

## 2017-05-24 DIAGNOSIS — R2681 Unsteadiness on feet: Secondary | ICD-10-CM | POA: Diagnosis not present

## 2017-05-24 DIAGNOSIS — Z79899 Other long term (current) drug therapy: Secondary | ICD-10-CM | POA: Diagnosis not present

## 2017-05-24 DIAGNOSIS — F0280 Dementia in other diseases classified elsewhere without behavioral disturbance: Secondary | ICD-10-CM | POA: Diagnosis present

## 2017-05-24 DIAGNOSIS — E854 Organ-limited amyloidosis: Secondary | ICD-10-CM | POA: Diagnosis not present

## 2017-05-24 DIAGNOSIS — I10 Essential (primary) hypertension: Secondary | ICD-10-CM | POA: Diagnosis not present

## 2017-05-24 DIAGNOSIS — E785 Hyperlipidemia, unspecified: Secondary | ICD-10-CM | POA: Diagnosis present

## 2017-05-24 DIAGNOSIS — I680 Cerebral amyloid angiopathy: Secondary | ICD-10-CM | POA: Diagnosis not present

## 2017-05-24 DIAGNOSIS — I69311 Memory deficit following cerebral infarction: Secondary | ICD-10-CM | POA: Diagnosis not present

## 2017-05-24 DIAGNOSIS — I610 Nontraumatic intracerebral hemorrhage in hemisphere, subcortical: Secondary | ICD-10-CM | POA: Diagnosis not present

## 2017-05-24 DIAGNOSIS — S0003XA Contusion of scalp, initial encounter: Secondary | ICD-10-CM | POA: Diagnosis present

## 2017-05-24 DIAGNOSIS — S066X0A Traumatic subarachnoid hemorrhage without loss of consciousness, initial encounter: Secondary | ICD-10-CM | POA: Diagnosis not present

## 2017-05-24 DIAGNOSIS — E039 Hypothyroidism, unspecified: Secondary | ICD-10-CM | POA: Diagnosis present

## 2017-05-24 DIAGNOSIS — I609 Nontraumatic subarachnoid hemorrhage, unspecified: Secondary | ICD-10-CM | POA: Diagnosis not present

## 2017-05-24 DIAGNOSIS — E11649 Type 2 diabetes mellitus with hypoglycemia without coma: Secondary | ICD-10-CM | POA: Diagnosis present

## 2017-05-24 DIAGNOSIS — I611 Nontraumatic intracerebral hemorrhage in hemisphere, cortical: Secondary | ICD-10-CM | POA: Diagnosis not present

## 2017-05-24 DIAGNOSIS — Z043 Encounter for examination and observation following other accident: Secondary | ICD-10-CM | POA: Diagnosis not present

## 2017-05-24 DIAGNOSIS — F0150 Vascular dementia without behavioral disturbance: Secondary | ICD-10-CM | POA: Diagnosis not present

## 2017-05-24 DIAGNOSIS — I618 Other nontraumatic intracerebral hemorrhage: Secondary | ICD-10-CM | POA: Diagnosis not present

## 2017-05-24 DIAGNOSIS — I619 Nontraumatic intracerebral hemorrhage, unspecified: Secondary | ICD-10-CM | POA: Diagnosis not present

## 2017-05-24 DIAGNOSIS — I629 Nontraumatic intracranial hemorrhage, unspecified: Secondary | ICD-10-CM | POA: Diagnosis not present

## 2017-05-24 DIAGNOSIS — Z8679 Personal history of other diseases of the circulatory system: Secondary | ICD-10-CM | POA: Diagnosis not present

## 2017-05-24 DIAGNOSIS — R55 Syncope and collapse: Secondary | ICD-10-CM | POA: Diagnosis not present

## 2017-05-24 DIAGNOSIS — E119 Type 2 diabetes mellitus without complications: Secondary | ICD-10-CM | POA: Diagnosis not present

## 2017-05-24 DIAGNOSIS — W19XXXA Unspecified fall, initial encounter: Secondary | ICD-10-CM | POA: Diagnosis not present

## 2017-05-24 DIAGNOSIS — Z7984 Long term (current) use of oral hypoglycemic drugs: Secondary | ICD-10-CM | POA: Diagnosis not present

## 2017-05-24 DIAGNOSIS — R41841 Cognitive communication deficit: Secondary | ICD-10-CM | POA: Diagnosis not present

## 2017-06-02 DIAGNOSIS — E039 Hypothyroidism, unspecified: Secondary | ICD-10-CM | POA: Diagnosis not present

## 2017-06-02 DIAGNOSIS — R5381 Other malaise: Secondary | ICD-10-CM | POA: Diagnosis not present

## 2017-06-02 DIAGNOSIS — R2681 Unsteadiness on feet: Secondary | ICD-10-CM | POA: Diagnosis not present

## 2017-06-02 DIAGNOSIS — F0150 Vascular dementia without behavioral disturbance: Secondary | ICD-10-CM | POA: Diagnosis not present

## 2017-06-02 DIAGNOSIS — E119 Type 2 diabetes mellitus without complications: Secondary | ICD-10-CM | POA: Diagnosis not present

## 2017-06-02 DIAGNOSIS — I10 Essential (primary) hypertension: Secondary | ICD-10-CM | POA: Diagnosis not present

## 2017-06-02 DIAGNOSIS — I629 Nontraumatic intracranial hemorrhage, unspecified: Secondary | ICD-10-CM | POA: Diagnosis not present

## 2017-06-02 DIAGNOSIS — R296 Repeated falls: Secondary | ICD-10-CM | POA: Diagnosis not present

## 2017-06-02 DIAGNOSIS — E785 Hyperlipidemia, unspecified: Secondary | ICD-10-CM | POA: Diagnosis not present

## 2017-06-02 DIAGNOSIS — M6281 Muscle weakness (generalized): Secondary | ICD-10-CM | POA: Diagnosis not present

## 2017-06-02 DIAGNOSIS — R41841 Cognitive communication deficit: Secondary | ICD-10-CM | POA: Diagnosis not present

## 2017-06-02 DIAGNOSIS — I680 Cerebral amyloid angiopathy: Secondary | ICD-10-CM | POA: Diagnosis not present

## 2017-06-03 DIAGNOSIS — R5381 Other malaise: Secondary | ICD-10-CM | POA: Diagnosis not present

## 2017-06-06 DIAGNOSIS — R5381 Other malaise: Secondary | ICD-10-CM | POA: Diagnosis not present

## 2017-06-09 DIAGNOSIS — R5381 Other malaise: Secondary | ICD-10-CM | POA: Diagnosis not present

## 2017-06-12 DIAGNOSIS — R5381 Other malaise: Secondary | ICD-10-CM | POA: Diagnosis not present

## 2017-06-16 DIAGNOSIS — R5381 Other malaise: Secondary | ICD-10-CM | POA: Diagnosis not present

## 2017-06-23 DIAGNOSIS — R5381 Other malaise: Secondary | ICD-10-CM | POA: Diagnosis not present

## 2017-06-27 DIAGNOSIS — E119 Type 2 diabetes mellitus without complications: Secondary | ICD-10-CM | POA: Diagnosis not present

## 2017-06-27 DIAGNOSIS — I680 Cerebral amyloid angiopathy: Secondary | ICD-10-CM | POA: Diagnosis not present

## 2017-06-27 DIAGNOSIS — G308 Other Alzheimer's disease: Secondary | ICD-10-CM | POA: Diagnosis not present

## 2017-06-27 DIAGNOSIS — R54 Age-related physical debility: Secondary | ICD-10-CM | POA: Diagnosis not present

## 2017-06-27 DIAGNOSIS — K219 Gastro-esophageal reflux disease without esophagitis: Secondary | ICD-10-CM | POA: Diagnosis not present

## 2017-06-27 DIAGNOSIS — I10 Essential (primary) hypertension: Secondary | ICD-10-CM | POA: Diagnosis not present

## 2017-06-27 DIAGNOSIS — R262 Difficulty in walking, not elsewhere classified: Secondary | ICD-10-CM | POA: Diagnosis not present

## 2017-07-03 DIAGNOSIS — R262 Difficulty in walking, not elsewhere classified: Secondary | ICD-10-CM | POA: Diagnosis not present

## 2017-07-03 DIAGNOSIS — M6281 Muscle weakness (generalized): Secondary | ICD-10-CM | POA: Diagnosis not present

## 2017-07-03 DIAGNOSIS — R2681 Unsteadiness on feet: Secondary | ICD-10-CM | POA: Diagnosis not present

## 2017-07-04 DIAGNOSIS — E119 Type 2 diabetes mellitus without complications: Secondary | ICD-10-CM | POA: Diagnosis not present

## 2017-07-04 DIAGNOSIS — G308 Other Alzheimer's disease: Secondary | ICD-10-CM | POA: Diagnosis not present

## 2017-07-04 DIAGNOSIS — I10 Essential (primary) hypertension: Secondary | ICD-10-CM | POA: Diagnosis not present

## 2017-07-04 DIAGNOSIS — R54 Age-related physical debility: Secondary | ICD-10-CM | POA: Diagnosis not present

## 2017-07-04 DIAGNOSIS — K219 Gastro-esophageal reflux disease without esophagitis: Secondary | ICD-10-CM | POA: Diagnosis not present

## 2017-07-04 DIAGNOSIS — R262 Difficulty in walking, not elsewhere classified: Secondary | ICD-10-CM | POA: Diagnosis not present

## 2017-07-04 DIAGNOSIS — I680 Cerebral amyloid angiopathy: Secondary | ICD-10-CM | POA: Diagnosis not present

## 2017-07-07 DIAGNOSIS — M6281 Muscle weakness (generalized): Secondary | ICD-10-CM | POA: Diagnosis not present

## 2017-07-07 DIAGNOSIS — R262 Difficulty in walking, not elsewhere classified: Secondary | ICD-10-CM | POA: Diagnosis not present

## 2017-07-07 DIAGNOSIS — R2681 Unsteadiness on feet: Secondary | ICD-10-CM | POA: Diagnosis not present

## 2017-07-09 DIAGNOSIS — R2681 Unsteadiness on feet: Secondary | ICD-10-CM | POA: Diagnosis not present

## 2017-07-09 DIAGNOSIS — R262 Difficulty in walking, not elsewhere classified: Secondary | ICD-10-CM | POA: Diagnosis not present

## 2017-07-09 DIAGNOSIS — M6281 Muscle weakness (generalized): Secondary | ICD-10-CM | POA: Diagnosis not present

## 2017-07-09 IMAGING — CR DG CHEST 2V
2 series · 2 of 2 positions shown · non-contrast
Comparison: Chest radiograph September 08, 2014; chest CT December 01, 2015

CLINICAL DATA: Shortness of breath and cough for 2 days

EXAM:
CHEST  2 VIEW

[w chest pa]
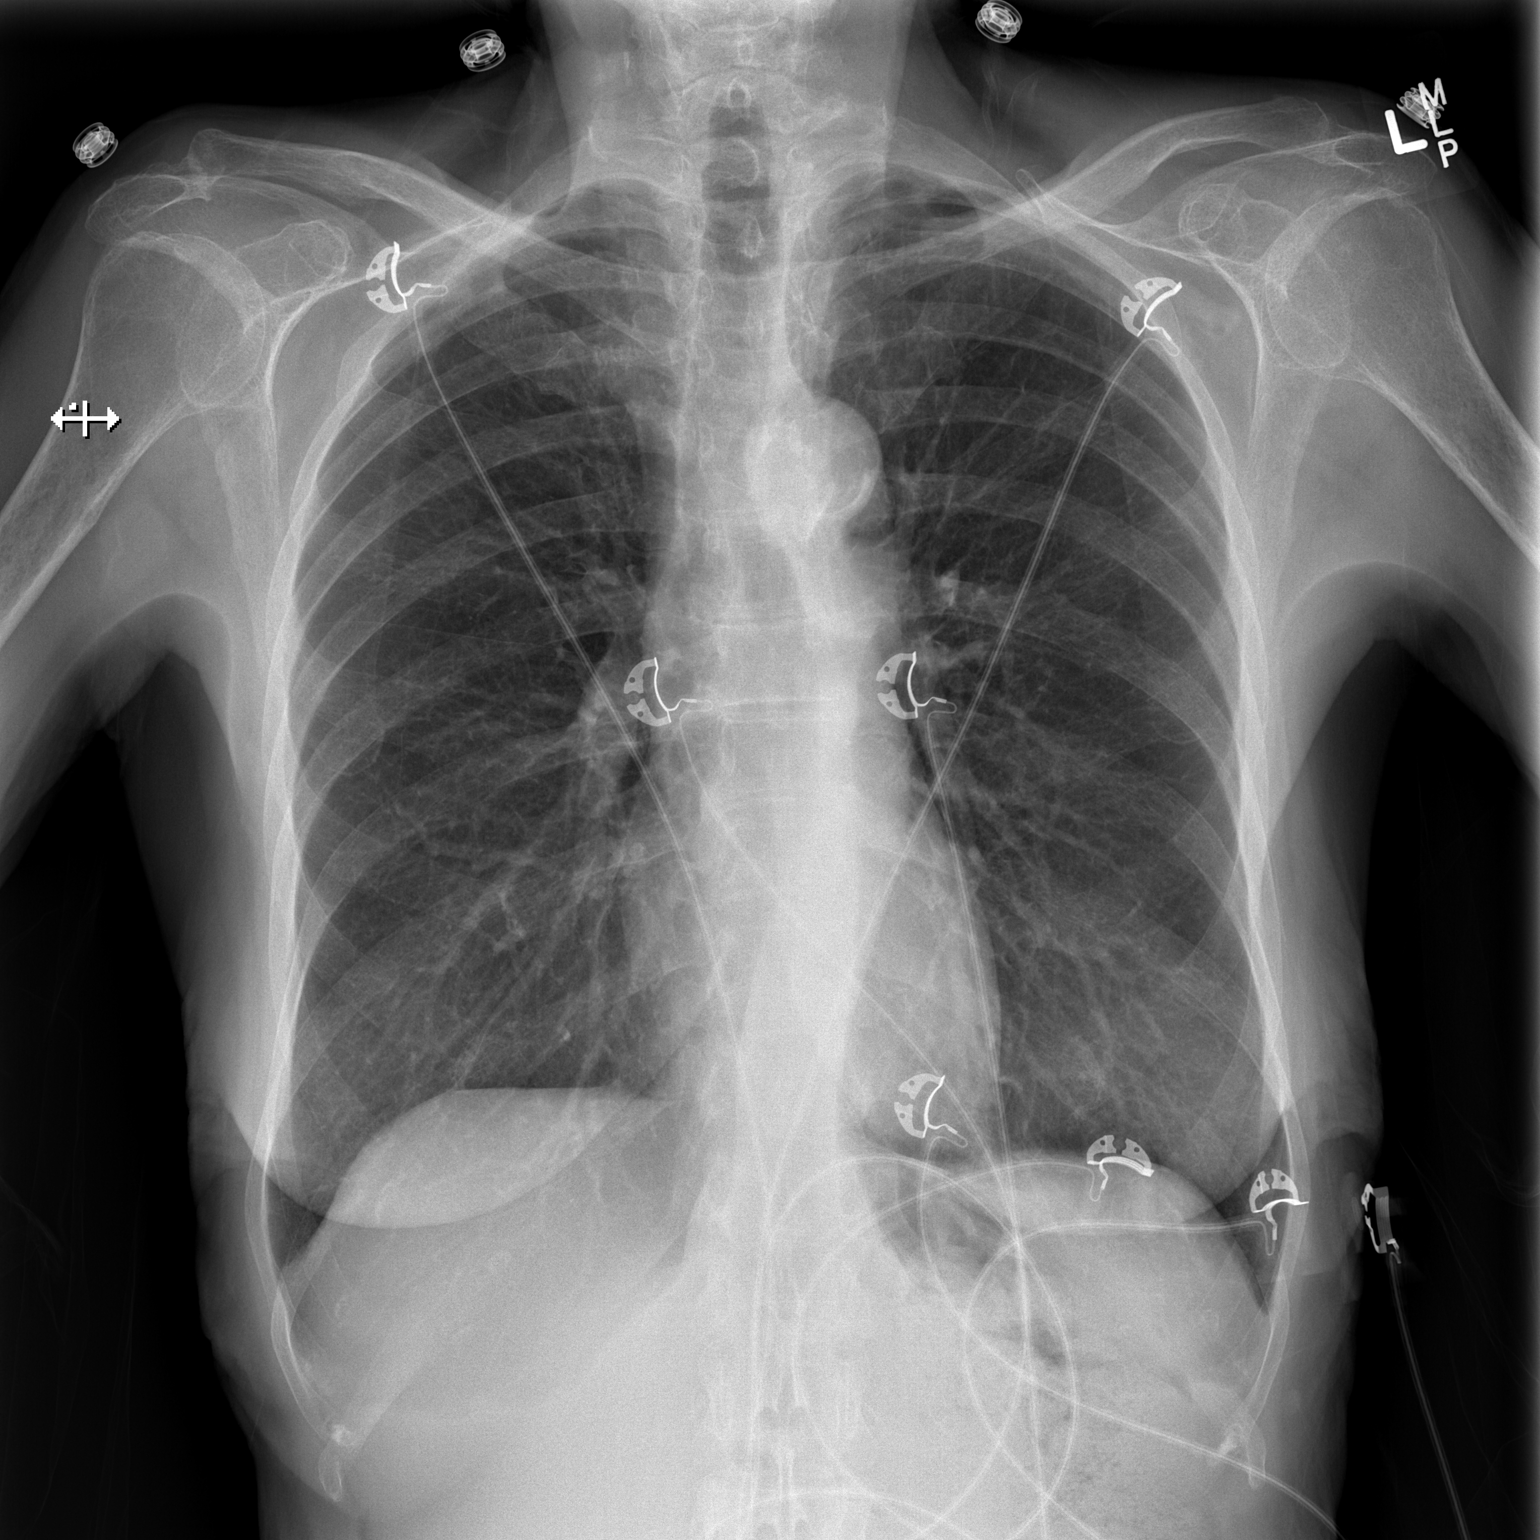

[w chest lat]
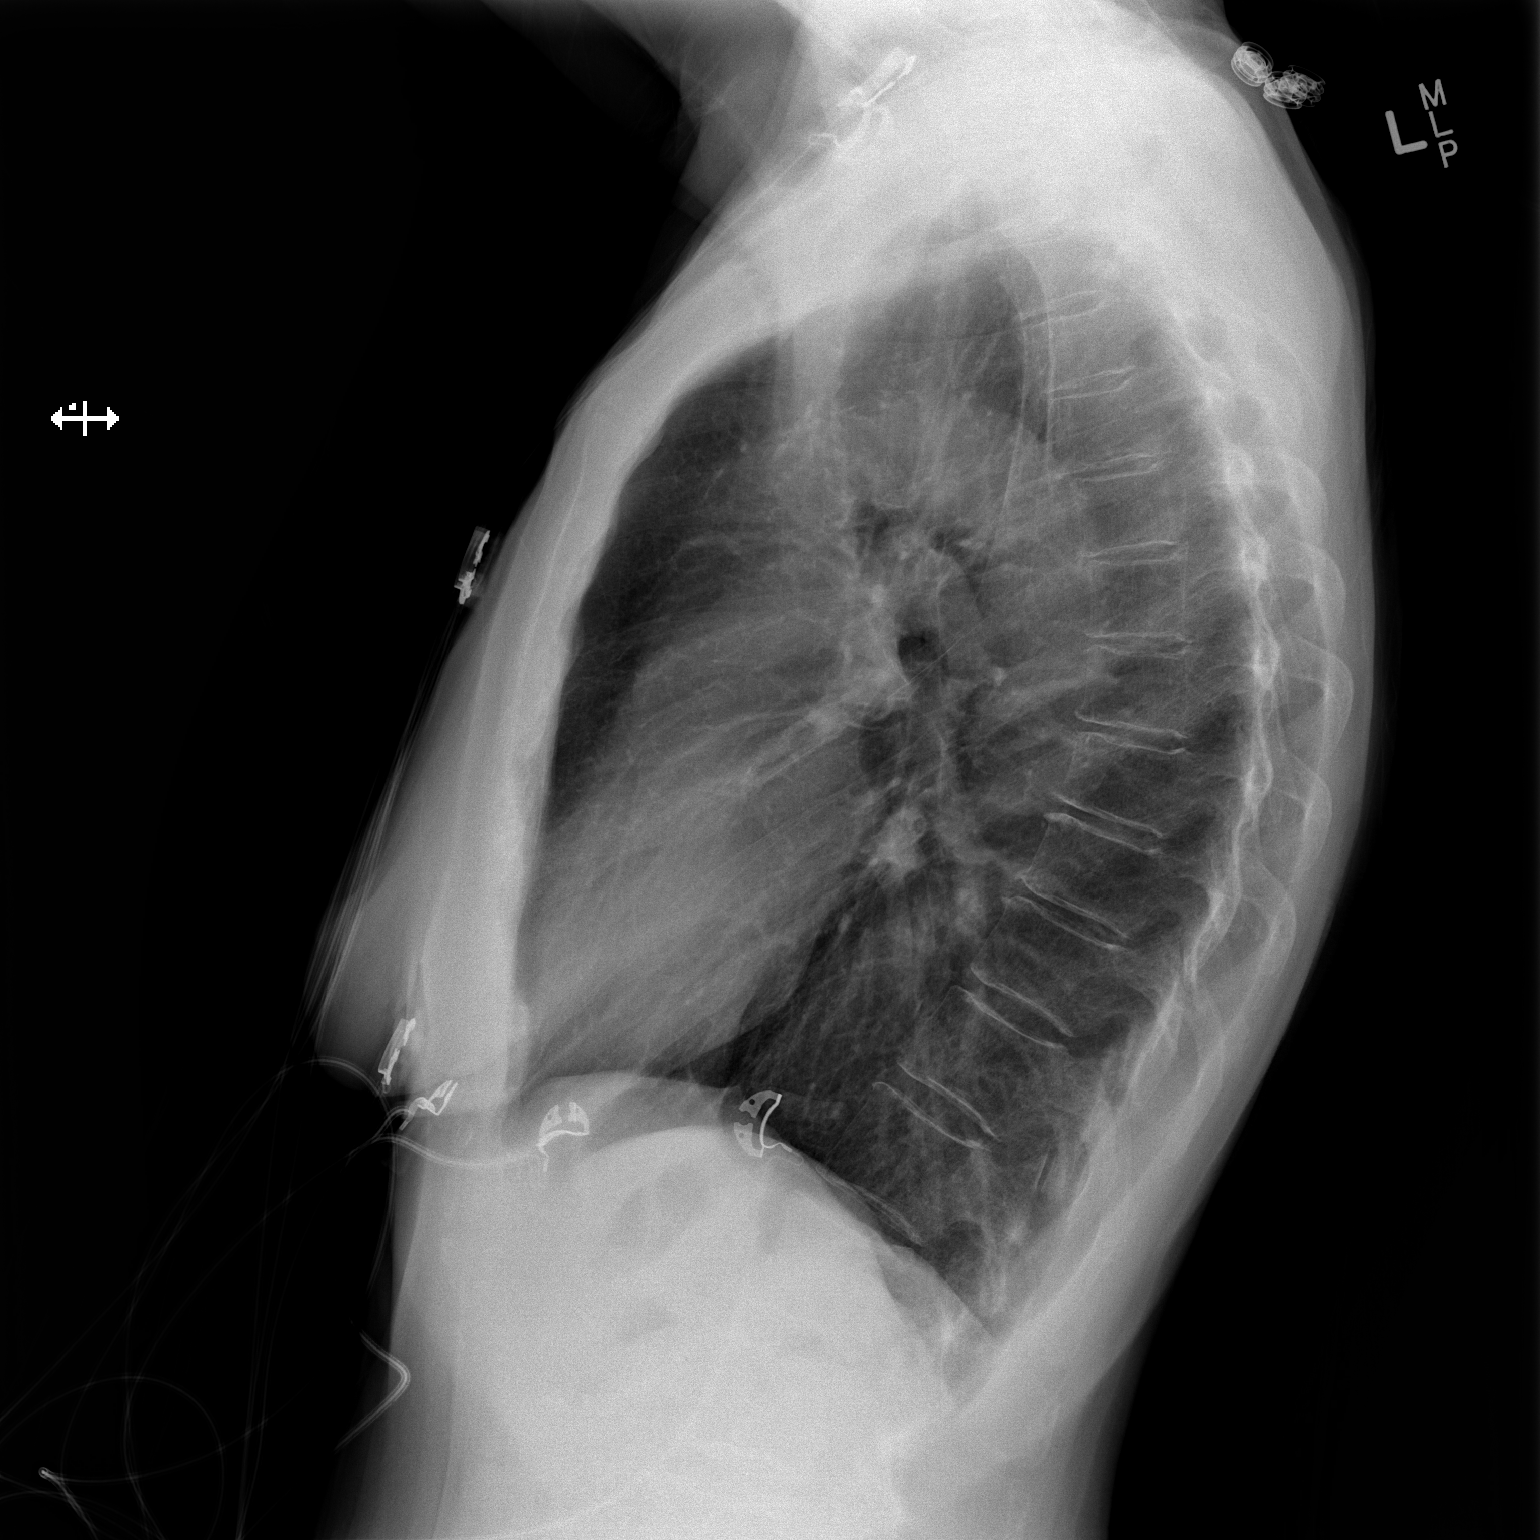

[2 of 2 positions shown; findings below may reference images not displayed]

FINDINGS: There is no edema or consolidation. The heart size and pulmonary
vascularity are normal. There is atherosclerotic calcification
aortic arch region. No bone lesions.
IMPRESSION: Aortic atherosclerosis.  No edema or consolidation.

## 2017-07-11 DIAGNOSIS — R2681 Unsteadiness on feet: Secondary | ICD-10-CM | POA: Diagnosis not present

## 2017-07-11 DIAGNOSIS — R262 Difficulty in walking, not elsewhere classified: Secondary | ICD-10-CM | POA: Diagnosis not present

## 2017-07-11 DIAGNOSIS — M6281 Muscle weakness (generalized): Secondary | ICD-10-CM | POA: Diagnosis not present

## 2017-07-14 DIAGNOSIS — R2681 Unsteadiness on feet: Secondary | ICD-10-CM | POA: Diagnosis not present

## 2017-07-14 DIAGNOSIS — R262 Difficulty in walking, not elsewhere classified: Secondary | ICD-10-CM | POA: Diagnosis not present

## 2017-07-14 DIAGNOSIS — M6281 Muscle weakness (generalized): Secondary | ICD-10-CM | POA: Diagnosis not present

## 2017-07-15 DIAGNOSIS — R2681 Unsteadiness on feet: Secondary | ICD-10-CM | POA: Diagnosis not present

## 2017-07-15 DIAGNOSIS — R262 Difficulty in walking, not elsewhere classified: Secondary | ICD-10-CM | POA: Diagnosis not present

## 2017-07-15 DIAGNOSIS — M6281 Muscle weakness (generalized): Secondary | ICD-10-CM | POA: Diagnosis not present

## 2017-07-16 DIAGNOSIS — R262 Difficulty in walking, not elsewhere classified: Secondary | ICD-10-CM | POA: Diagnosis not present

## 2017-07-16 DIAGNOSIS — R2681 Unsteadiness on feet: Secondary | ICD-10-CM | POA: Diagnosis not present

## 2017-07-16 DIAGNOSIS — M6281 Muscle weakness (generalized): Secondary | ICD-10-CM | POA: Diagnosis not present

## 2017-07-18 DIAGNOSIS — M6281 Muscle weakness (generalized): Secondary | ICD-10-CM | POA: Diagnosis not present

## 2017-07-18 DIAGNOSIS — R262 Difficulty in walking, not elsewhere classified: Secondary | ICD-10-CM | POA: Diagnosis not present

## 2017-07-18 DIAGNOSIS — R2681 Unsteadiness on feet: Secondary | ICD-10-CM | POA: Diagnosis not present

## 2017-07-21 DIAGNOSIS — I6789 Other cerebrovascular disease: Secondary | ICD-10-CM | POA: Diagnosis not present

## 2017-07-22 DIAGNOSIS — R2681 Unsteadiness on feet: Secondary | ICD-10-CM | POA: Diagnosis not present

## 2017-07-22 DIAGNOSIS — H01005 Unspecified blepharitis left lower eyelid: Secondary | ICD-10-CM | POA: Diagnosis not present

## 2017-07-22 DIAGNOSIS — M1990 Unspecified osteoarthritis, unspecified site: Secondary | ICD-10-CM | POA: Diagnosis not present

## 2017-07-22 DIAGNOSIS — I6789 Other cerebrovascular disease: Secondary | ICD-10-CM | POA: Diagnosis not present

## 2017-07-22 DIAGNOSIS — I10 Essential (primary) hypertension: Secondary | ICD-10-CM | POA: Diagnosis not present

## 2017-07-22 DIAGNOSIS — R54 Age-related physical debility: Secondary | ICD-10-CM | POA: Diagnosis not present

## 2017-07-22 DIAGNOSIS — R262 Difficulty in walking, not elsewhere classified: Secondary | ICD-10-CM | POA: Diagnosis not present

## 2017-07-22 DIAGNOSIS — M6281 Muscle weakness (generalized): Secondary | ICD-10-CM | POA: Diagnosis not present

## 2017-07-22 DIAGNOSIS — R269 Unspecified abnormalities of gait and mobility: Secondary | ICD-10-CM | POA: Diagnosis not present

## 2017-07-22 DIAGNOSIS — E119 Type 2 diabetes mellitus without complications: Secondary | ICD-10-CM | POA: Diagnosis not present

## 2017-07-23 DIAGNOSIS — R2681 Unsteadiness on feet: Secondary | ICD-10-CM | POA: Diagnosis not present

## 2017-07-23 DIAGNOSIS — R262 Difficulty in walking, not elsewhere classified: Secondary | ICD-10-CM | POA: Diagnosis not present

## 2017-07-23 DIAGNOSIS — M6281 Muscle weakness (generalized): Secondary | ICD-10-CM | POA: Diagnosis not present

## 2017-07-24 DIAGNOSIS — R2681 Unsteadiness on feet: Secondary | ICD-10-CM | POA: Diagnosis not present

## 2017-07-24 DIAGNOSIS — M6281 Muscle weakness (generalized): Secondary | ICD-10-CM | POA: Diagnosis not present

## 2017-07-24 DIAGNOSIS — R262 Difficulty in walking, not elsewhere classified: Secondary | ICD-10-CM | POA: Diagnosis not present

## 2017-07-25 DIAGNOSIS — R262 Difficulty in walking, not elsewhere classified: Secondary | ICD-10-CM | POA: Diagnosis not present

## 2017-07-25 DIAGNOSIS — R2681 Unsteadiness on feet: Secondary | ICD-10-CM | POA: Diagnosis not present

## 2017-07-25 DIAGNOSIS — M6281 Muscle weakness (generalized): Secondary | ICD-10-CM | POA: Diagnosis not present

## 2017-07-29 DIAGNOSIS — R262 Difficulty in walking, not elsewhere classified: Secondary | ICD-10-CM | POA: Diagnosis not present

## 2017-07-29 DIAGNOSIS — R2681 Unsteadiness on feet: Secondary | ICD-10-CM | POA: Diagnosis not present

## 2017-07-29 DIAGNOSIS — M6281 Muscle weakness (generalized): Secondary | ICD-10-CM | POA: Diagnosis not present

## 2017-07-29 DIAGNOSIS — Z79899 Other long term (current) drug therapy: Secondary | ICD-10-CM | POA: Diagnosis not present

## 2017-07-30 DIAGNOSIS — R2681 Unsteadiness on feet: Secondary | ICD-10-CM | POA: Diagnosis not present

## 2017-07-30 DIAGNOSIS — M6281 Muscle weakness (generalized): Secondary | ICD-10-CM | POA: Diagnosis not present

## 2017-07-30 DIAGNOSIS — R262 Difficulty in walking, not elsewhere classified: Secondary | ICD-10-CM | POA: Diagnosis not present

## 2017-07-31 DIAGNOSIS — R262 Difficulty in walking, not elsewhere classified: Secondary | ICD-10-CM | POA: Diagnosis not present

## 2017-07-31 DIAGNOSIS — M6281 Muscle weakness (generalized): Secondary | ICD-10-CM | POA: Diagnosis not present

## 2017-07-31 DIAGNOSIS — R2681 Unsteadiness on feet: Secondary | ICD-10-CM | POA: Diagnosis not present

## 2017-08-02 DIAGNOSIS — M6281 Muscle weakness (generalized): Secondary | ICD-10-CM | POA: Diagnosis not present

## 2017-08-02 DIAGNOSIS — R262 Difficulty in walking, not elsewhere classified: Secondary | ICD-10-CM | POA: Diagnosis not present

## 2017-08-02 DIAGNOSIS — R2681 Unsteadiness on feet: Secondary | ICD-10-CM | POA: Diagnosis not present

## 2017-08-04 DIAGNOSIS — R262 Difficulty in walking, not elsewhere classified: Secondary | ICD-10-CM | POA: Diagnosis not present

## 2017-08-04 DIAGNOSIS — R2681 Unsteadiness on feet: Secondary | ICD-10-CM | POA: Diagnosis not present

## 2017-08-04 DIAGNOSIS — M6281 Muscle weakness (generalized): Secondary | ICD-10-CM | POA: Diagnosis not present

## 2017-08-05 DIAGNOSIS — R262 Difficulty in walking, not elsewhere classified: Secondary | ICD-10-CM | POA: Diagnosis not present

## 2017-08-05 DIAGNOSIS — M6281 Muscle weakness (generalized): Secondary | ICD-10-CM | POA: Diagnosis not present

## 2017-08-05 DIAGNOSIS — R2681 Unsteadiness on feet: Secondary | ICD-10-CM | POA: Diagnosis not present

## 2017-08-06 DIAGNOSIS — M6281 Muscle weakness (generalized): Secondary | ICD-10-CM | POA: Diagnosis not present

## 2017-08-06 DIAGNOSIS — R262 Difficulty in walking, not elsewhere classified: Secondary | ICD-10-CM | POA: Diagnosis not present

## 2017-08-06 DIAGNOSIS — R2681 Unsteadiness on feet: Secondary | ICD-10-CM | POA: Diagnosis not present

## 2017-08-07 DIAGNOSIS — M6281 Muscle weakness (generalized): Secondary | ICD-10-CM | POA: Diagnosis not present

## 2017-08-07 DIAGNOSIS — R2681 Unsteadiness on feet: Secondary | ICD-10-CM | POA: Diagnosis not present

## 2017-08-07 DIAGNOSIS — R262 Difficulty in walking, not elsewhere classified: Secondary | ICD-10-CM | POA: Diagnosis not present

## 2017-08-08 DIAGNOSIS — R262 Difficulty in walking, not elsewhere classified: Secondary | ICD-10-CM | POA: Diagnosis not present

## 2017-08-08 DIAGNOSIS — R2681 Unsteadiness on feet: Secondary | ICD-10-CM | POA: Diagnosis not present

## 2017-08-08 DIAGNOSIS — M6281 Muscle weakness (generalized): Secondary | ICD-10-CM | POA: Diagnosis not present

## 2017-08-11 DIAGNOSIS — R2681 Unsteadiness on feet: Secondary | ICD-10-CM | POA: Diagnosis not present

## 2017-08-11 DIAGNOSIS — R262 Difficulty in walking, not elsewhere classified: Secondary | ICD-10-CM | POA: Diagnosis not present

## 2017-08-11 DIAGNOSIS — M6281 Muscle weakness (generalized): Secondary | ICD-10-CM | POA: Diagnosis not present

## 2017-08-12 DIAGNOSIS — I10 Essential (primary) hypertension: Secondary | ICD-10-CM | POA: Diagnosis not present

## 2017-08-12 DIAGNOSIS — R269 Unspecified abnormalities of gait and mobility: Secondary | ICD-10-CM | POA: Diagnosis not present

## 2017-08-12 DIAGNOSIS — Z66 Do not resuscitate: Secondary | ICD-10-CM | POA: Diagnosis not present

## 2017-08-12 DIAGNOSIS — R5381 Other malaise: Secondary | ICD-10-CM | POA: Diagnosis not present

## 2017-08-12 DIAGNOSIS — E119 Type 2 diabetes mellitus without complications: Secondary | ICD-10-CM | POA: Diagnosis not present

## 2017-08-12 DIAGNOSIS — I6789 Other cerebrovascular disease: Secondary | ICD-10-CM | POA: Diagnosis not present

## 2017-08-12 DIAGNOSIS — G3184 Mild cognitive impairment, so stated: Secondary | ICD-10-CM | POA: Diagnosis not present

## 2017-08-12 DIAGNOSIS — M6281 Muscle weakness (generalized): Secondary | ICD-10-CM | POA: Diagnosis not present

## 2017-08-12 DIAGNOSIS — R54 Age-related physical debility: Secondary | ICD-10-CM | POA: Diagnosis not present

## 2017-08-12 DIAGNOSIS — R262 Difficulty in walking, not elsewhere classified: Secondary | ICD-10-CM | POA: Diagnosis not present

## 2017-08-12 DIAGNOSIS — R2681 Unsteadiness on feet: Secondary | ICD-10-CM | POA: Diagnosis not present

## 2017-08-13 DIAGNOSIS — R2681 Unsteadiness on feet: Secondary | ICD-10-CM | POA: Diagnosis not present

## 2017-08-13 DIAGNOSIS — R262 Difficulty in walking, not elsewhere classified: Secondary | ICD-10-CM | POA: Diagnosis not present

## 2017-08-13 DIAGNOSIS — M6281 Muscle weakness (generalized): Secondary | ICD-10-CM | POA: Diagnosis not present

## 2017-08-15 DIAGNOSIS — R262 Difficulty in walking, not elsewhere classified: Secondary | ICD-10-CM | POA: Diagnosis not present

## 2017-08-15 DIAGNOSIS — R2681 Unsteadiness on feet: Secondary | ICD-10-CM | POA: Diagnosis not present

## 2017-08-15 DIAGNOSIS — M6281 Muscle weakness (generalized): Secondary | ICD-10-CM | POA: Diagnosis not present

## 2017-08-18 DIAGNOSIS — M6281 Muscle weakness (generalized): Secondary | ICD-10-CM | POA: Diagnosis not present

## 2017-08-18 DIAGNOSIS — R262 Difficulty in walking, not elsewhere classified: Secondary | ICD-10-CM | POA: Diagnosis not present

## 2017-08-18 DIAGNOSIS — R2681 Unsteadiness on feet: Secondary | ICD-10-CM | POA: Diagnosis not present

## 2017-08-19 DIAGNOSIS — R262 Difficulty in walking, not elsewhere classified: Secondary | ICD-10-CM | POA: Diagnosis not present

## 2017-08-19 DIAGNOSIS — R2681 Unsteadiness on feet: Secondary | ICD-10-CM | POA: Diagnosis not present

## 2017-08-19 DIAGNOSIS — M6281 Muscle weakness (generalized): Secondary | ICD-10-CM | POA: Diagnosis not present

## 2017-08-20 DIAGNOSIS — R2681 Unsteadiness on feet: Secondary | ICD-10-CM | POA: Diagnosis not present

## 2017-08-20 DIAGNOSIS — M6281 Muscle weakness (generalized): Secondary | ICD-10-CM | POA: Diagnosis not present

## 2017-08-20 DIAGNOSIS — R262 Difficulty in walking, not elsewhere classified: Secondary | ICD-10-CM | POA: Diagnosis not present

## 2017-08-22 DIAGNOSIS — R262 Difficulty in walking, not elsewhere classified: Secondary | ICD-10-CM | POA: Diagnosis not present

## 2017-08-22 DIAGNOSIS — M6281 Muscle weakness (generalized): Secondary | ICD-10-CM | POA: Diagnosis not present

## 2017-08-22 DIAGNOSIS — R2681 Unsteadiness on feet: Secondary | ICD-10-CM | POA: Diagnosis not present

## 2017-08-25 DIAGNOSIS — R2681 Unsteadiness on feet: Secondary | ICD-10-CM | POA: Diagnosis not present

## 2017-08-25 DIAGNOSIS — R262 Difficulty in walking, not elsewhere classified: Secondary | ICD-10-CM | POA: Diagnosis not present

## 2017-08-25 DIAGNOSIS — M6281 Muscle weakness (generalized): Secondary | ICD-10-CM | POA: Diagnosis not present

## 2017-08-26 DIAGNOSIS — M6281 Muscle weakness (generalized): Secondary | ICD-10-CM | POA: Diagnosis not present

## 2017-08-26 DIAGNOSIS — R2681 Unsteadiness on feet: Secondary | ICD-10-CM | POA: Diagnosis not present

## 2017-08-26 DIAGNOSIS — R262 Difficulty in walking, not elsewhere classified: Secondary | ICD-10-CM | POA: Diagnosis not present

## 2017-08-28 DIAGNOSIS — R262 Difficulty in walking, not elsewhere classified: Secondary | ICD-10-CM | POA: Diagnosis not present

## 2017-08-28 DIAGNOSIS — R2681 Unsteadiness on feet: Secondary | ICD-10-CM | POA: Diagnosis not present

## 2017-08-28 DIAGNOSIS — M6281 Muscle weakness (generalized): Secondary | ICD-10-CM | POA: Diagnosis not present

## 2017-08-29 DIAGNOSIS — F0280 Dementia in other diseases classified elsewhere without behavioral disturbance: Secondary | ICD-10-CM | POA: Diagnosis not present

## 2017-08-29 DIAGNOSIS — I10 Essential (primary) hypertension: Secondary | ICD-10-CM | POA: Diagnosis not present

## 2017-08-29 DIAGNOSIS — R54 Age-related physical debility: Secondary | ICD-10-CM | POA: Diagnosis not present

## 2017-08-29 DIAGNOSIS — R2681 Unsteadiness on feet: Secondary | ICD-10-CM | POA: Diagnosis not present

## 2017-08-29 DIAGNOSIS — E119 Type 2 diabetes mellitus without complications: Secondary | ICD-10-CM | POA: Diagnosis not present

## 2017-08-29 DIAGNOSIS — M6281 Muscle weakness (generalized): Secondary | ICD-10-CM | POA: Diagnosis not present

## 2017-08-29 DIAGNOSIS — R262 Difficulty in walking, not elsewhere classified: Secondary | ICD-10-CM | POA: Diagnosis not present

## 2017-08-29 DIAGNOSIS — E038 Other specified hypothyroidism: Secondary | ICD-10-CM | POA: Diagnosis not present

## 2017-08-29 DIAGNOSIS — I680 Cerebral amyloid angiopathy: Secondary | ICD-10-CM | POA: Diagnosis not present

## 2017-09-01 DIAGNOSIS — Z5181 Encounter for therapeutic drug level monitoring: Secondary | ICD-10-CM | POA: Diagnosis not present

## 2017-09-01 DIAGNOSIS — I6789 Other cerebrovascular disease: Secondary | ICD-10-CM | POA: Diagnosis not present

## 2017-09-02 DIAGNOSIS — R262 Difficulty in walking, not elsewhere classified: Secondary | ICD-10-CM | POA: Diagnosis not present

## 2017-09-02 DIAGNOSIS — R2681 Unsteadiness on feet: Secondary | ICD-10-CM | POA: Diagnosis not present

## 2017-09-02 DIAGNOSIS — M6281 Muscle weakness (generalized): Secondary | ICD-10-CM | POA: Diagnosis not present

## 2017-09-05 DIAGNOSIS — I10 Essential (primary) hypertension: Secondary | ICD-10-CM | POA: Diagnosis not present

## 2017-09-05 DIAGNOSIS — R2681 Unsteadiness on feet: Secondary | ICD-10-CM | POA: Diagnosis not present

## 2017-09-05 DIAGNOSIS — M6281 Muscle weakness (generalized): Secondary | ICD-10-CM | POA: Diagnosis not present

## 2017-09-05 DIAGNOSIS — R262 Difficulty in walking, not elsewhere classified: Secondary | ICD-10-CM | POA: Diagnosis not present

## 2017-09-05 DIAGNOSIS — I680 Cerebral amyloid angiopathy: Secondary | ICD-10-CM | POA: Diagnosis not present

## 2017-09-05 DIAGNOSIS — R1110 Vomiting, unspecified: Secondary | ICD-10-CM | POA: Diagnosis not present

## 2017-09-05 DIAGNOSIS — E119 Type 2 diabetes mellitus without complications: Secondary | ICD-10-CM | POA: Diagnosis not present

## 2017-09-09 DIAGNOSIS — L605 Yellow nail syndrome: Secondary | ICD-10-CM | POA: Diagnosis not present

## 2017-09-09 DIAGNOSIS — M2060 Acquired deformities of toe(s), unspecified, unspecified foot: Secondary | ICD-10-CM | POA: Diagnosis not present

## 2017-09-09 DIAGNOSIS — B351 Tinea unguium: Secondary | ICD-10-CM | POA: Diagnosis not present

## 2017-09-09 DIAGNOSIS — I739 Peripheral vascular disease, unspecified: Secondary | ICD-10-CM | POA: Diagnosis not present

## 2017-09-09 DIAGNOSIS — R209 Unspecified disturbances of skin sensation: Secondary | ICD-10-CM | POA: Diagnosis not present

## 2017-09-09 DIAGNOSIS — M24576 Contracture, unspecified foot: Secondary | ICD-10-CM | POA: Diagnosis not present

## 2017-09-11 DIAGNOSIS — R2681 Unsteadiness on feet: Secondary | ICD-10-CM | POA: Diagnosis not present

## 2017-09-11 DIAGNOSIS — M6281 Muscle weakness (generalized): Secondary | ICD-10-CM | POA: Diagnosis not present

## 2017-09-12 DIAGNOSIS — R2681 Unsteadiness on feet: Secondary | ICD-10-CM | POA: Diagnosis not present

## 2017-09-12 DIAGNOSIS — M6281 Muscle weakness (generalized): Secondary | ICD-10-CM | POA: Diagnosis not present

## 2017-09-18 DIAGNOSIS — M6281 Muscle weakness (generalized): Secondary | ICD-10-CM | POA: Diagnosis not present

## 2017-09-18 DIAGNOSIS — R2681 Unsteadiness on feet: Secondary | ICD-10-CM | POA: Diagnosis not present

## 2017-09-20 DIAGNOSIS — R2681 Unsteadiness on feet: Secondary | ICD-10-CM | POA: Diagnosis not present

## 2017-09-20 DIAGNOSIS — M6281 Muscle weakness (generalized): Secondary | ICD-10-CM | POA: Diagnosis not present

## 2017-09-23 DIAGNOSIS — M6281 Muscle weakness (generalized): Secondary | ICD-10-CM | POA: Diagnosis not present

## 2017-09-23 DIAGNOSIS — R2681 Unsteadiness on feet: Secondary | ICD-10-CM | POA: Diagnosis not present

## 2017-09-29 DIAGNOSIS — R2681 Unsteadiness on feet: Secondary | ICD-10-CM | POA: Diagnosis not present

## 2017-09-29 DIAGNOSIS — M6281 Muscle weakness (generalized): Secondary | ICD-10-CM | POA: Diagnosis not present

## 2017-09-30 DIAGNOSIS — R2681 Unsteadiness on feet: Secondary | ICD-10-CM | POA: Diagnosis not present

## 2017-09-30 DIAGNOSIS — M6281 Muscle weakness (generalized): Secondary | ICD-10-CM | POA: Diagnosis not present

## 2017-10-01 DIAGNOSIS — R2681 Unsteadiness on feet: Secondary | ICD-10-CM | POA: Diagnosis not present

## 2017-10-01 DIAGNOSIS — M6281 Muscle weakness (generalized): Secondary | ICD-10-CM | POA: Diagnosis not present

## 2018-02-15 IMAGING — RF DG FLUORO GUIDE LUMBAR PUNCTURE
1 series · 1 of 1 positions shown · non-contrast
Comparison: none

CLINICAL DATA: Cognitive decline.  Cerebral amyloid angiopathy.

[Series 1: cp_standard · 0.30mm/px · 1 of 1 slices shown]
[im 1/1]
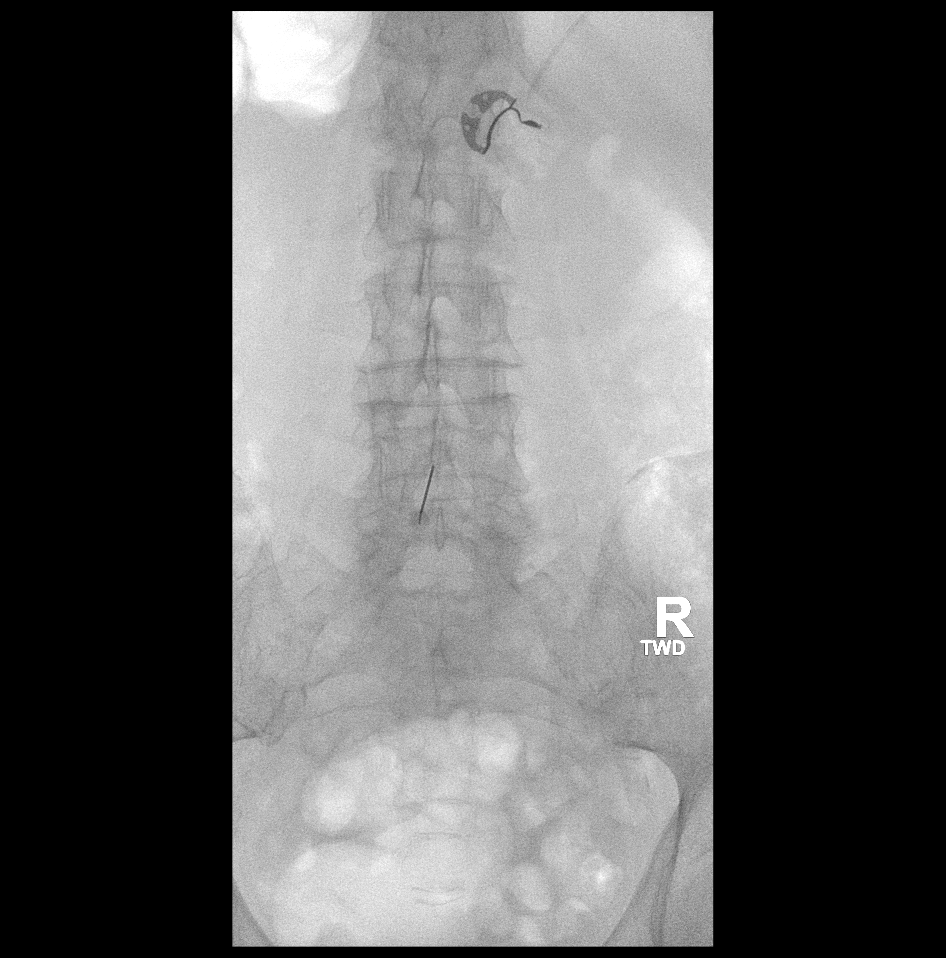

[1 of 1 positions shown; findings below may reference images not displayed]

EXAM:
DIAGNOSTIC LUMBAR PUNCTURE UNDER FLUOROSCOPIC GUIDANCE

FLUOROSCOPY TIME:  Fluoroscopy Time:  0 minutes 24 seconds

PROCEDURE:
Informed consent was obtained from the patient prior to the
procedure, including potential complications of headache, allergy,
and pain. With the patient prone, the lower back was prepped with
Betadine. 1% Lidocaine was used for local anesthesia. Lumbar
puncture was performed at the L4-5 level using a 20 gauge needle
with return of clear colorless CSF. 19.5 Ml of CSF were obtained for
laboratory studies. The patient tolerated the procedure well and
there were no apparent complications.
IMPRESSION: Lumbar puncture performed at the L4-5 level without complication.

## 2024-05-08 DEATH — deceased
